# Patient Record
Sex: Female | Born: 1960 | Race: White | Hispanic: No | Marital: Married | Smoking: Never smoker
Health system: Southern US, Community
[De-identification: ages and names within clinical notes are randomized; demographics above are authoritative.]

## PROBLEM LIST (undated history)

## (undated) DIAGNOSIS — K219 Gastro-esophageal reflux disease without esophagitis: Secondary | ICD-10-CM

## (undated) DIAGNOSIS — E079 Disorder of thyroid, unspecified: Secondary | ICD-10-CM

## (undated) HISTORY — DX: Gastro-esophageal reflux disease without esophagitis: K21.9

## (undated) HISTORY — PX: AUGMENTATION MAMMAPLASTY: SUR837

## (undated) HISTORY — DX: Disorder of thyroid, unspecified: E07.9

---

## 1993-06-07 HISTORY — PX: AUGMENTATION MAMMAPLASTY: SUR837

## 1998-08-28 ENCOUNTER — Ambulatory Visit: Admission: RE | Admit: 1998-08-28 | Discharge: 1998-08-28 | Payer: Self-pay | Admitting: Gynecology

## 1998-09-12 ENCOUNTER — Inpatient Hospital Stay (HOSPITAL_COMMUNITY): Admission: RE | Admit: 1998-09-12 | Discharge: 1998-09-13 | Payer: Self-pay | Admitting: Gynecology

## 1999-03-17 ENCOUNTER — Other Ambulatory Visit: Admission: RE | Admit: 1999-03-17 | Discharge: 1999-03-17 | Payer: Self-pay | Admitting: Gynecology

## 2000-09-13 ENCOUNTER — Other Ambulatory Visit: Admission: RE | Admit: 2000-09-13 | Discharge: 2000-09-13 | Payer: Self-pay | Admitting: Gynecology

## 2002-01-09 ENCOUNTER — Other Ambulatory Visit: Admission: RE | Admit: 2002-01-09 | Discharge: 2002-01-09 | Payer: Self-pay | Admitting: Gynecology

## 2006-01-04 ENCOUNTER — Other Ambulatory Visit: Admission: RE | Admit: 2006-01-04 | Discharge: 2006-01-04 | Payer: Self-pay | Admitting: Gynecology

## 2007-01-05 ENCOUNTER — Other Ambulatory Visit: Admission: RE | Admit: 2007-01-05 | Discharge: 2007-01-05 | Payer: Self-pay | Admitting: Gynecology

## 2008-04-16 ENCOUNTER — Ambulatory Visit: Payer: Self-pay | Admitting: Internal Medicine

## 2008-08-03 ENCOUNTER — Ambulatory Visit: Payer: Self-pay | Admitting: Internal Medicine

## 2009-03-11 ENCOUNTER — Ambulatory Visit: Payer: Self-pay | Admitting: Internal Medicine

## 2009-06-18 ENCOUNTER — Ambulatory Visit: Payer: Self-pay | Admitting: Gastroenterology

## 2009-06-27 LAB — HM COLONOSCOPY: HM Colonoscopy: NORMAL

## 2010-03-26 ENCOUNTER — Ambulatory Visit: Payer: Self-pay | Admitting: Internal Medicine

## 2010-05-26 ENCOUNTER — Ambulatory Visit: Payer: Self-pay | Admitting: Internal Medicine

## 2010-12-04 ENCOUNTER — Other Ambulatory Visit: Payer: Self-pay | Admitting: Internal Medicine

## 2011-01-25 ENCOUNTER — Other Ambulatory Visit: Payer: Self-pay | Admitting: Internal Medicine

## 2011-01-25 MED ORDER — LEVOTHYROXINE SODIUM 88 MCG PO TABS: 88.0000 ug | ORAL_TABLET | Freq: Every day | ORAL | Status: DC

## 2011-03-15 ENCOUNTER — Ambulatory Visit: Payer: Self-pay | Admitting: Internal Medicine

## 2011-03-31 ENCOUNTER — Ambulatory Visit (INDEPENDENT_AMBULATORY_CARE_PROVIDER_SITE_OTHER): Payer: PRIVATE HEALTH INSURANCE | Admitting: Internal Medicine

## 2011-03-31 ENCOUNTER — Encounter: Payer: Self-pay | Admitting: Internal Medicine

## 2011-03-31 DIAGNOSIS — Z124 Encounter for screening for malignant neoplasm of cervix: Secondary | ICD-10-CM

## 2011-03-31 DIAGNOSIS — K219 Gastro-esophageal reflux disease without esophagitis: Secondary | ICD-10-CM

## 2011-03-31 DIAGNOSIS — E039 Hypothyroidism, unspecified: Secondary | ICD-10-CM | POA: Insufficient documentation

## 2011-03-31 DIAGNOSIS — Z1239 Encounter for other screening for malignant neoplasm of breast: Secondary | ICD-10-CM

## 2011-03-31 NOTE — Assessment & Plan Note (Addendum)
Her repeat TSH 5.15 after reducing Synthroid brand name dose to 88 so she has resumed 100 mcg daily and feels much better x 3 weeks.  Will repeat tsh later on this fall to follow level.

## 2011-04-01 ENCOUNTER — Encounter: Payer: Self-pay | Admitting: Internal Medicine

## 2011-04-01 DIAGNOSIS — Z1239 Encounter for other screening for malignant neoplasm of breast: Secondary | ICD-10-CM | POA: Insufficient documentation

## 2011-04-01 DIAGNOSIS — Z124 Encounter for screening for malignant neoplasm of cervix: Secondary | ICD-10-CM | POA: Insufficient documentation

## 2011-04-01 DIAGNOSIS — K219 Gastro-esophageal reflux disease without esophagitis: Secondary | ICD-10-CM | POA: Insufficient documentation

## 2011-04-01 NOTE — Assessment & Plan Note (Signed)
She receives annual mammogram at [facility] and was given rx for this year's screening.

## 2011-04-01 NOTE — Assessment & Plan Note (Signed)
Her symptoms are infrequent and aggravated by large meals and wine.  She does not use a PPI on a daily basis.

## 2011-04-01 NOTE — Progress Notes (Signed)
  Subjective:    Patient ID: Governor Specking, female    DOB: [birth year], 50 y.o.   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  Several months ago her thyroid dose was reduced due to symptoms of fatigue concurrent with a TSH of 0.457.  She has had a repeat TSH one month ago which was now elevated at 5.15 and feels the same but has gained a few lbs.  She has resumed her previous dose 3 weeks ago and feels better.  She attributes her earlier fatigue to her schedule , which involved working full time and spending several nights per week with her convalescing elderly mother in [place].  She denies palpitations,  Tremor, headache and insomnia.  Past Medical History  Diagnosis Date  . hypothyroid   . GERD (gastroesophageal reflux disease)     infrequent   No current outpatient prescriptions on file prior to visit.     Review of Systems  Constitutional: Negative for fever, chills and unexpected weight change.  HENT: Negative for hearing loss, ear pain, nosebleeds, congestion, sore throat, facial swelling, rhinorrhea, sneezing, mouth sores, trouble swallowing, neck pain, neck stiffness, voice change, postnasal drip, sinus pressure, tinnitus and ear discharge.   Eyes: Negative for pain, discharge, redness and visual disturbance.  Respiratory: Negative for cough, chest tightness, shortness of breath, wheezing and stridor.   Cardiovascular: Negative for chest pain, palpitations and leg swelling.  Musculoskeletal: Negative for myalgias and arthralgias.  Skin: Negative for color change and rash.  Neurological: Negative for dizziness, weakness, light-headedness and headaches.  Hematological: Negative for adenopathy.       Objective:   Physical Exam  Constitutional: She is oriented to person, place, and time. She appears well-developed and well-nourished.  HENT:  Mouth/Throat: Oropharynx is  clear and moist.  Eyes: EOM are normal. Pupils are equal, round, and reactive to light. No scleral icterus.  Neck: Normal range of motion. Neck supple. No JVD present. No thyromegaly present.  Cardiovascular: Normal rate, regular rhythm, normal heart sounds and intact distal pulses.   Pulmonary/Chest: Effort normal and breath sounds normal.  Abdominal: Soft. Bowel sounds are normal. She exhibits no mass. There is no tenderness.  Musculoskeletal: Normal range of motion. She exhibits no edema.  Lymphadenopathy:    She has no cervical adenopathy.  Neurological: She is alert and oriented to person, place, and time.  Skin: Skin is warm and dry.  Psychiatric: She has a normal mood and affect.          Assessment & Plan:

## 2011-04-01 NOTE — Assessment & Plan Note (Signed)
She is postmenopausal x 7 years,  And her last PAP was 2008.  Have referred her to Senaida Lange for followup at her request.

## 2011-04-27 ENCOUNTER — Ambulatory Visit: Payer: Self-pay | Admitting: Internal Medicine

## 2011-05-02 ENCOUNTER — Other Ambulatory Visit: Payer: Self-pay | Admitting: Internal Medicine

## 2011-05-13 ENCOUNTER — Encounter: Payer: Self-pay | Admitting: Internal Medicine

## 2011-07-12 ENCOUNTER — Other Ambulatory Visit: Payer: Self-pay | Admitting: *Deleted

## 2011-07-12 NOTE — Telephone Encounter (Signed)
Faxed request from [facility] for synthroid 100 mcg's.  Chart has that pt takes 88 mcg's.  Please advise.

## 2011-07-13 MED ORDER — LEVOTHYROXINE SODIUM 100 MCG PO TABS: 100.0000 ug | ORAL_TABLET | Freq: Every day | ORAL | Status: DC

## 2011-07-13 NOTE — Telephone Encounter (Signed)
100 mcg dose

## 2011-07-13 NOTE — Telephone Encounter (Signed)
Per office notes the dose is 100 mcg daily. Refill and that dose thank you quantity 90 with 2 refills

## 2011-07-13 NOTE — Telephone Encounter (Signed)
Medicine called to [facility] pharmacy.

## 2012-01-13 ENCOUNTER — Encounter: Payer: Self-pay | Admitting: Internal Medicine

## 2012-04-25 ENCOUNTER — Encounter: Payer: Self-pay | Admitting: Internal Medicine

## 2012-04-25 ENCOUNTER — Ambulatory Visit (INDEPENDENT_AMBULATORY_CARE_PROVIDER_SITE_OTHER): Payer: PRIVATE HEALTH INSURANCE | Admitting: Internal Medicine

## 2012-04-25 VITALS — BP 118/68 | HR 74 | Temp 98.1°F | Resp 12 | Ht 60.0 in | Wt 115.5 lb

## 2012-04-25 DIAGNOSIS — Z124 Encounter for screening for malignant neoplasm of cervix: Secondary | ICD-10-CM

## 2012-04-25 DIAGNOSIS — E039 Hypothyroidism, unspecified: Secondary | ICD-10-CM

## 2012-04-25 DIAGNOSIS — N951 Menopausal and female climacteric states: Secondary | ICD-10-CM

## 2012-04-25 DIAGNOSIS — N941 Unspecified dyspareunia: Secondary | ICD-10-CM

## 2012-04-25 DIAGNOSIS — IMO0002 Reserved for concepts with insufficient information to code with codable children: Secondary | ICD-10-CM

## 2012-04-25 NOTE — Progress Notes (Signed)
Patient ID: Joy Horton, female   DOB: 04/01/1961, 51 y.o.   MRN: [id number]  Patient Active Problem List  Diagnosis  . Hypothyroidism  . GERD (gastroesophageal reflux disease)  . Screening for breast cancer  . Screening for cervical cancer  . Dyspareunia, female  . Menopausal vaginal dryness    Subjective:  CC:   Chief Complaint  Patient presents with  . Medication Refill    HPI:   Joy Horton is a 51 y.o. female who presents 12 month follow up on chronic conditions.  She was treated for a recent upper respiratory infection with azithromycin by Dr. Margette Fast for symptoms of sore throat myalgias without cough or fever. Her symptoms have resolved. She's had a recent gynecologic exam by Dr. Waverly Ferrari,   and has been prescribed Premarin cream  for symptoms of dyspareunia.     Past Medical History  Diagnosis Date  . hypothyroid   . GERD (gastroesophageal reflux disease)     infrequent    Past Surgical History  Procedure Date  . Augmentation mammaplasty          The following portions of the patient's history were reviewed and updated as appropriate: Allergies, current medications, and problem list.    Review of Systems:  Review of Systems  Constitutional: Negative for fever and chills.  HENT: Negative for congestion and sore throat.   Respiratory: Positive for wheezing. Negative for cough.   Cardiovascular: Negative for chest pain.  Gastrointestinal: Negative for heartburn.  Genitourinary: Negative for dysuria and flank pain.  Musculoskeletal: Negative for back pain.  Neurological: Negative for dizziness and headaches.  Endo/Heme/Allergies: Does not bruise/bleed easily.  Psychiatric/Behavioral: Negative for depression. The patient does not have insomnia.        History   Social History  . Marital Status: Married    Spouse Name: N/A    Number of Children: N/A  . Years of Education: N/A   Occupational History  . Not on file.    Social History Main Topics  . Smoking status: Never Smoker   . Smokeless tobacco: Never Used  . Alcohol Use: 2.5 oz/week    5 drink(s) per week  . Drug Use: No  . Sexually Active: Yes    Birth Control/ Protection: Post-menopausal   Other Topics Concern  . Not on file   Social History Narrative  . No narrative on file    Objective:  BP 118/68  Pulse 74  Temp 98.1 F (36.7 C) (Oral)  Resp 12  Ht 5' (1.524 m)  Wt 115 lb 8 oz (52.39 kg)  BMI 22.56 kg/m2  SpO2 98%  General appearance: alert, cooperative and appears stated age Ears: normal TM's and external ear canals both ears Throat: lips, mucosa, and tongue normal; teeth and gums normal Neck: no adenopathy, no carotid bruit, supple, symmetrical, trachea midline and thyroid not enlarged, symmetric, no tenderness/mass/nodules Back: symmetric, no curvature. ROM normal. No CVA tenderness. Lungs: clear to auscultation bilaterally Heart: regular rate and rhythm, S1, S2 normal, no murmur, click, rub or gallop Abdomen: soft, non-tender; bowel sounds normal; no masses,  no organomegaly Pulses: 2+ and symmetric Skin: Skin color, texture, turgor normal. No rashes or lesions Lymph nodes: Cervical, supraclavicular, and axillary nodes normal.  Assessment and Plan:  Hypothyroidism She is due for refill on Synthroid, but has not had TSH checked since October 2012 at which time her dose was increased for a TSH of 5.15. She has enough samples to get her through  the next week and she will get her labs including fasting lipids done at [facility].  Screening for cervical cancer Screening with Pap smear was done by Dr. Waverly Ferrari this year.  Menopausal vaginal dryness ZPremarin Cream prescribed by her gynecologist.  Dyspareunia, female Secondary to  postmenopausal vaginal dryness. Patient has started using Premarin vaginal cream.   Updated Medication List Outpatient Encounter Prescriptions as of 04/25/2012  Medication Sig Dispense Refill  .  levothyroxine (SYNTHROID, LEVOTHROID) 100 MCG tablet Take 1 tablet (100 mcg total) by mouth daily.  90 tablet  2  . Multiple Vitamin (MULTIVITAMIN) tablet Take 1 tablet by mouth daily.           Orders Placed This Encounter  Procedures  . HM MAMMOGRAPHY  . HM PAP SMEAR  . HM COLONOSCOPY    No Follow-up on file.

## 2012-04-26 ENCOUNTER — Other Ambulatory Visit: Payer: Self-pay | Admitting: Internal Medicine

## 2012-04-26 LAB — CBC WITH DIFFERENTIAL/PLATELET
Basophil #: 0.1 10*3/uL (ref 0.0–0.1)
Eosinophil %: 4.7 %
HGB: 13.1 g/dL (ref 12.0–16.0)
Lymphocyte #: 1.8 10*3/uL (ref 1.0–3.6)
Lymphocyte %: 23.7 %
MCV: 93 fL (ref 80–100)
Monocyte %: 7.3 %
Neutrophil #: 4.7 10*3/uL (ref 1.4–6.5)
Neutrophil %: 62.9 %
RDW: 12.4 % (ref 11.5–14.5)

## 2012-04-26 LAB — COMPREHENSIVE METABOLIC PANEL
Alkaline Phosphatase: 67 U/L (ref 50–136)
Anion Gap: 5 — ABNORMAL LOW (ref 7–16)
BUN: 14 mg/dL (ref 7–18)
Bilirubin,Total: 0.5 mg/dL (ref 0.2–1.0)
Chloride: 106 mmol/L (ref 98–107)
Creatinine: 0.6 mg/dL (ref 0.60–1.30)
EGFR (African American): >60
Osmolality: 279 (ref 275–301)
Potassium: 3.9 mmol/L (ref 3.5–5.1)
SGOT(AST): 29 U/L (ref 15–37)
Sodium: 140 mmol/L (ref 136–145)
Total Protein: 7.6 g/dL (ref 6.4–8.2)

## 2012-04-27 ENCOUNTER — Ambulatory Visit: Payer: Self-pay | Admitting: Internal Medicine

## 2012-04-27 ENCOUNTER — Encounter: Payer: Self-pay | Admitting: Internal Medicine

## 2012-04-27 ENCOUNTER — Telehealth: Payer: Self-pay | Admitting: Internal Medicine

## 2012-04-27 DIAGNOSIS — N941 Unspecified dyspareunia: Secondary | ICD-10-CM | POA: Insufficient documentation

## 2012-04-27 DIAGNOSIS — L900 Lichen sclerosus et atrophicus: Secondary | ICD-10-CM | POA: Insufficient documentation

## 2012-04-27 LAB — HM MAMMOGRAPHY: HM Mammogram: NORMAL

## 2012-04-27 NOTE — Telephone Encounter (Signed)
Labs are all normal.

## 2012-04-27 NOTE — Assessment & Plan Note (Signed)
She is due for refill on Synthroid, but has not had TSH checked since October 2012 at which time her dose was increased for a TSH of 5.15. She has enough samples to get her through the next week and she will get her labs including fasting lipids done at [facility].

## 2012-04-27 NOTE — Assessment & Plan Note (Signed)
ZPremarin Cream prescribed by her gynecologist.

## 2012-04-27 NOTE — Assessment & Plan Note (Signed)
Secondary to  postmenopausal vaginal dryness. Patient has started using Premarin vaginal cream.

## 2012-04-27 NOTE — Telephone Encounter (Signed)
Left message with patient spouse that all labs were normal.

## 2012-04-27 NOTE — Assessment & Plan Note (Signed)
Screening with Pap smear was done by Dr. Waverly Ferrari this year.

## 2012-05-08 ENCOUNTER — Encounter: Payer: Self-pay | Admitting: Internal Medicine

## 2012-05-08 ENCOUNTER — Other Ambulatory Visit: Payer: Self-pay | Admitting: Internal Medicine

## 2012-05-08 MED ORDER — LEVOTHYROXINE SODIUM 100 MCG PO TABS: 100.0000 ug | ORAL_TABLET | Freq: Every day | ORAL | Status: DC

## 2012-05-08 NOTE — Progress Notes (Signed)
Patient notified via phone of lab results.

## 2013-02-08 ENCOUNTER — Other Ambulatory Visit: Payer: Self-pay | Admitting: *Deleted

## 2013-02-08 MED ORDER — LEVOTHYROXINE SODIUM 100 MCG PO TABS: 100.0000 ug | ORAL_TABLET | Freq: Every day | ORAL | Status: DC

## 2013-02-12 ENCOUNTER — Encounter: Payer: Self-pay | Admitting: Internal Medicine

## 2013-02-12 ENCOUNTER — Ambulatory Visit (INDEPENDENT_AMBULATORY_CARE_PROVIDER_SITE_OTHER): Payer: 59 | Admitting: Internal Medicine

## 2013-02-12 VITALS — BP 108/68 | HR 80 | Temp 98.3°F | Resp 12 | Ht 60.0 in | Wt 113.2 lb

## 2013-02-12 DIAGNOSIS — J069 Acute upper respiratory infection, unspecified: Secondary | ICD-10-CM

## 2013-02-12 DIAGNOSIS — E039 Hypothyroidism, unspecified: Secondary | ICD-10-CM

## 2013-02-12 DIAGNOSIS — M47812 Spondylosis without myelopathy or radiculopathy, cervical region: Secondary | ICD-10-CM | POA: Insufficient documentation

## 2013-02-12 MED ORDER — LEVOTHYROXINE SODIUM 100 MCG PO TABS: 100.0000 ug | ORAL_TABLET | Freq: Every day | ORAL | Status: DC

## 2013-02-12 NOTE — Assessment & Plan Note (Signed)
Suggested by history,  Exam normal.  recommended strengthening exercises with low weights.  Reassess height of chair and keyboard at work station

## 2013-02-12 NOTE — Assessment & Plan Note (Signed)
With bilateral cervical LAD  Tonsillar erythema and rhinitis .  Symptomatic management.

## 2013-02-12 NOTE — Assessment & Plan Note (Signed)
Continue current dose of synthroid generic and repeat TSH at [facility] in November.

## 2013-02-12 NOTE — Progress Notes (Signed)
Patient ID: Joy Horton, female   DOB: 04/18/1961, 52 y.o.   MRN: [id number]  Patient Active Problem List   Diagnosis Date Noted  . Viral URI 02/12/2013  . Dyspareunia, female 04/27/2012  . Menopausal vaginal dryness 04/27/2012  . Screening for breast cancer 04/01/2011  . Screening for cervical cancer 04/01/2011  . GERD (gastroesophageal reflux disease)   . Hypothyroidism 03/31/2011    Subjective:  CC:   Chief Complaint  Patient presents with  . Follow-up    med refills    HPI:   Joy Horton a 52 y.o. female who presents for 6 month follow up on hypothyroidism.  Energy level fine, weight stabe,  Not exercising   Today feels "blah"  ,  Some sneezing and runny nose,  Attributed it to cat allergy.  Took claritin.  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 hypothyroid   . GERD (gastroesophageal reflux disease)     infrequent    Past Surgical History  Procedure Laterality Date  . Augmentation mammaplasty         The following portions of the patient's history were reviewed and updated as appropriate: Allergies, current medications, and problem list.    Review of Systems:   12 Pt  review of systems was negative except those addressed in the HPI,     History   Social History  . Marital Status: Married    Spouse Name: N/A    Number of Children: N/A  . Years of Education: N/A   Occupational History  . Not on file.   Social History Main Topics  . Smoking status: Never Smoker   . Smokeless tobacco: Never Used  . Alcohol Use: 2.5 oz/week    5 drink(s) per week  . Drug Use: No  . Sexual Activity: Yes    Birth Control/ Protection: Post-menopausal   Other Topics Concern  . Not on file   Social History Narrative  . No narrative on file     Objective:  Filed Vitals:   02/12/13 1333  BP: 108/68  Pulse: 80  Temp: 98.3 F (36.8 C)  Resp: 12     General appearance: alert, cooperative and appears stated age Ears: normal TM's and external ear canals both ears Throat: lips, mucosa, and tongue normal; tonsillar erythema noted Neck:  no carotid bruit, supple, symmetrical, trachea midline and thyroid not enlarged, symmetric, no tenderness/mass/nodules Back: symmetric, no curvature. ROM normal. No CVA tenderness. Lungs: clear to auscultation bilaterally Heart: regular rate and rhythm, S1, S2 normal, no murmur, click, rub or gallop Abdomen: soft, non-tender; bowel sounds normal; no masses,  no organomegaly Pulses: 2+ and symmetric Skin: Skin color, texture, turgor normal. No rashes or lesions Lymph nodes: Cervical LAD noted today,  Other nodes  normal.  Assessment and Plan:  Viral URI With bilateral cervical LAD  Tonsillar erythema and rhinitis .  Symptomatic management.    Hypothyroidism Continue current dose of synthroid generic and repeat TSH at [facility] in November.   Cervical spondylosis without myelopathy Suggested by history,  Exam normal.  recommended strengthening exercises with low weights.  Reassess height of chair and keyboard at work station    Updated Medication List Outpatient Encounter Prescriptions as of 02/12/2013  Medication Sig Dispense Refill  .  levothyroxine (SYNTHROID, LEVOTHROID) 100 MCG tablet Take 1 tablet (100 mcg total) by mouth daily.  90 tablet  3  . [DISCONTINUED] levothyroxine (SYNTHROID, LEVOTHROID) 100 MCG tablet Take 1 tablet (100 mcg total) by mouth daily.  90 tablet  0  . Multiple Vitamin (MULTIVITAMIN) tablet Take 1 tablet by mouth daily.         No facility-administered encounter medications on file as of 02/12/2013.

## 2013-03-06 ENCOUNTER — Ambulatory Visit (INDEPENDENT_AMBULATORY_CARE_PROVIDER_SITE_OTHER): Payer: 59 | Admitting: Adult Health

## 2013-03-06 ENCOUNTER — Encounter: Payer: Self-pay | Admitting: Adult Health

## 2013-03-06 ENCOUNTER — Telehealth: Payer: Self-pay | Admitting: Internal Medicine

## 2013-03-06 VITALS — BP 102/70 | HR 69 | Temp 98.8°F | Resp 12 | Wt 113.5 lb

## 2013-03-06 DIAGNOSIS — J329 Chronic sinusitis, unspecified: Secondary | ICD-10-CM

## 2013-03-06 MED ORDER — FLUTICASONE PROPIONATE 50 MCG/ACT NA SUSP: NASAL | Status: DC

## 2013-03-06 MED ORDER — AMOXICILLIN-POT CLAVULANATE 875-125 MG PO TABS: 1.0000 | ORAL_TABLET | Freq: Two times a day (BID) | ORAL | Status: DC

## 2013-03-06 NOTE — Telephone Encounter (Signed)
Patient Information:  Caller Name: Marcelino Duster  Phone: [phone]  Patient: Joy Horton, Joy Horton  Gender: Female  DOB: [birth year]  Age: 52 Years  PCP: Duncan Dull (Adults only)  Pregnant: No  Office Follow Up:  Does the office need to follow up with this patient?: No  Instructions For The Office: N/A  RN Note:  Menopausal. Seen 02/12/13 for viral URI.  Symptoms improved but then returned. Denies sinus pain or cough.    Symptoms  Reason For Call & Symptoms: Recurrent nasal congestion, hoarseness, postnasal drip, and bilateral swollen glands in neck; no cough or fever.  Due to get Flu shot for work but concerned about symptoms.  Reviewed Health History In EMR: Yes  Reviewed Medications In EMR: Yes  Reviewed Allergies In EMR: Yes  Reviewed Surgeries / Procedures: Yes  Date of Onset of Symptoms: 02/28/2013  Treatments Tried: NyQuil, Tylenol, Advil  Treatments Tried Worked: Yes OB / GYN:  LMP: Unknown  Guideline(s) Used:  Colds  Disposition Per Guideline:   See Today or Tomorrow in Office  Reason For Disposition Reached:   Patient wants to be seen  Advice Given:  Reassurance  It sounds like an uncomplicated cold that we can treat at home.  Colds are caused by viruses, and no medicine or "shot" will cure an uncomplicated cold.  Colds are usually not serious.  For a Runny Nose With Profuse Discharge:   Nasal mucus and discharge helps to wash viruses and bacteria out of the nose and sinuses.  Blowing the nose is all that is needed.  For a Stuffy Nose - Use Nasal Washes:  Introduction: Saline (salt water) nasal irrigation (nasal wash) is an effective and simple home remedy for treating stuffy nose and sinus congestion. The nose can be irrigated by pouring, spraying, or squirting salt water into the nose and then letting it run back out.  Humidifier:  If the air in your home is dry, use a cool-mist humidifier  Contagiousness:  You can return to work or school after the fever is  gone and you feel well enough to participate in normal activities.  Expected Course:   Nasal discharge 7-14 days  Cough up to 2-3 weeks.  Call Back If:  Difficulty breathing occurs  Fever lasts more than 3 days  Nasal discharge lasts more than 10 days  You become worse  Patient Will Follow Care Advice:  YES  Appointment Scheduled:  03/06/2013 11:30:00 Appointment Scheduled Provider:  Orville Govern

## 2013-03-06 NOTE — Progress Notes (Signed)
  Subjective:    Patient ID: Joy Horton, female    DOB: [birth year], 52 y.o.   MRN: [id number]  HPI  Pt reports sinus congestion and hoarseness for 3 weeks. She saw Dr. Darrick Huntsman on 9/8 and was told it was viral but to call back if wasn't improved in a week.  Pt states she began to feel better but began feeling worse again last Wednesday.  Pt reports thick yellow-green mucous drainage.  Pt states she called and was told she needed to come back.   Current Outpatient Prescriptions on File Prior to Visit  Medication Sig Dispense Refill  . levothyroxine (SYNTHROID, LEVOTHROID) 100 MCG tablet Take 1 tablet (100 mcg total) by mouth daily.  90 tablet  3  . Multiple Vitamin (MULTIVITAMIN) tablet Take 1 tablet by mouth daily.         No current facility-administered medications on file prior to visit.     Review of Systems  Constitutional: Positive for fatigue.  HENT: Positive for congestion, voice change, postnasal drip and sinus pressure.   Eyes: Negative.   Respiratory: Positive for cough.   Cardiovascular: Negative.   Gastrointestinal: Negative.   Skin: Negative.   Psychiatric/Behavioral: Negative.        Objective:   Physical Exam  Constitutional: She is oriented to person, place, and time. She appears well-developed and well-nourished.  HENT:  Head: Normocephalic and atraumatic.  Mouth/Throat: Posterior oropharyngeal erythema present.  Neck: Normal range of motion.  Cardiovascular: Normal rate, regular rhythm and normal heart sounds.   Pulmonary/Chest: Effort normal and breath sounds normal. No respiratory distress. She has no wheezes. She has no rales.  Lymphadenopathy:    She has no cervical adenopathy.  Neurological: She is alert and oriented to person, place, and time.  Skin: Skin is warm and dry.  Psychiatric: She has a normal mood and affect. Her behavior is normal. Judgment and thought content normal.    BP 102/70  Pulse 69  Temp(Src) 98.8 F (37.1 C) (Oral)   Resp 12  Wt 113 lb 8 oz (51.483 kg)  BMI 22.17 kg/m2  SpO2 99%       Assessment & Plan:

## 2013-03-06 NOTE — Telephone Encounter (Signed)
Appt with Raquel today 

## 2013-03-06 NOTE — Assessment & Plan Note (Signed)
Symptoms ongoing for greater than 10 days. Start Augmentin bid x 10 days. flonase nasal spray - 2 sprays into each nostril daily.

## 2013-03-06 NOTE — Patient Instructions (Addendum)
  Start Augmentin twice a day for 10 days.  Flonase 2 sprays into each nostril daily.  Please call if your symptoms are not improved within 4-5 days.

## 2013-05-10 ENCOUNTER — Ambulatory Visit: Payer: Self-pay | Admitting: Internal Medicine

## 2013-06-04 ENCOUNTER — Encounter: Payer: Self-pay | Admitting: Internal Medicine

## 2014-05-01 ENCOUNTER — Encounter (INDEPENDENT_AMBULATORY_CARE_PROVIDER_SITE_OTHER): Payer: Self-pay

## 2014-05-01 ENCOUNTER — Ambulatory Visit (INDEPENDENT_AMBULATORY_CARE_PROVIDER_SITE_OTHER): Payer: 59 | Admitting: Internal Medicine

## 2014-05-01 ENCOUNTER — Encounter: Payer: Self-pay | Admitting: Internal Medicine

## 2014-05-01 VITALS — BP 102/60 | HR 69 | Temp 98.4°F | Resp 16 | Ht 60.0 in | Wt 111.5 lb

## 2014-05-01 DIAGNOSIS — W5503XA Scratched by cat, initial encounter: Secondary | ICD-10-CM

## 2014-05-01 DIAGNOSIS — E785 Hyperlipidemia, unspecified: Secondary | ICD-10-CM

## 2014-05-01 DIAGNOSIS — L0211 Cutaneous abscess of neck: Secondary | ICD-10-CM

## 2014-05-01 DIAGNOSIS — S0081XA Abrasion of other part of head, initial encounter: Secondary | ICD-10-CM

## 2014-05-01 DIAGNOSIS — R634 Abnormal weight loss: Secondary | ICD-10-CM

## 2014-05-01 DIAGNOSIS — E034 Atrophy of thyroid (acquired): Secondary | ICD-10-CM

## 2014-05-01 DIAGNOSIS — L03221 Cellulitis of neck: Secondary | ICD-10-CM

## 2014-05-01 DIAGNOSIS — E038 Other specified hypothyroidism: Secondary | ICD-10-CM

## 2014-05-01 DIAGNOSIS — Z0000 Encounter for general adult medical examination without abnormal findings: Secondary | ICD-10-CM

## 2014-05-01 LAB — COMPREHENSIVE METABOLIC PANEL
ALT: 15 U/L (ref 0–35)
AST: 20 U/L (ref 0–37)
Albumin: 4.5 g/dL (ref 3.5–5.2)
Alkaline Phosphatase: 56 U/L (ref 39–117)
BUN: 11 mg/dL (ref 6–23)
CO2: 28 mEq/L (ref 19–32)
CREATININE: 0.7 mg/dL (ref 0.4–1.2)
Calcium: 9.5 mg/dL (ref 8.4–10.5)
Chloride: 104 mEq/L (ref 96–112)
GFR: 90.67 mL/min (ref >60.00)
Glucose, Bld: 77 mg/dL (ref 70–99)
Potassium: 4.7 mEq/L (ref 3.5–5.1)
Sodium: 141 mEq/L (ref 135–145)
Total Bilirubin: 1 mg/dL (ref 0.2–1.2)
Total Protein: 6.6 g/dL (ref 6.0–8.3)

## 2014-05-01 LAB — CBC WITH DIFFERENTIAL/PLATELET
Basophils Absolute: 0 10*3/uL (ref 0.0–0.1)
Basophils Relative: 0.5 % (ref 0.0–3.0)
Eosinophils Absolute: 0.1 10*3/uL (ref 0.0–0.7)
Eosinophils Relative: 2.2 % (ref 0.0–5.0)
HEMATOCRIT: 40.8 % (ref 36.0–46.0)
HEMOGLOBIN: 13.7 g/dL (ref 12.0–15.0)
LYMPHS PCT: 25.9 % (ref 12.0–46.0)
Lymphs Abs: 1.4 10*3/uL (ref 0.7–4.0)
MCHC: 33.6 g/dL (ref 30.0–36.0)
MCV: 91.7 fl (ref 78.0–100.0)
MONOS PCT: 5.8 % (ref 3.0–12.0)
Monocytes Absolute: 0.3 10*3/uL (ref 0.1–1.0)
NEUTROS ABS: 3.5 10*3/uL (ref 1.4–7.7)
Neutrophils Relative %: 65.6 % (ref 43.0–77.0)
Platelets: 332 10*3/uL (ref 150.0–400.0)
RBC: 4.45 Mil/uL (ref 3.87–5.11)
RDW: 12.5 % (ref 11.5–15.5)
WBC: 5.4 10*3/uL (ref 4.0–10.5)

## 2014-05-01 LAB — LIPID PANEL
Cholesterol: 218 mg/dL — ABNORMAL HIGH (ref 0–200)
HDL: 51 mg/dL (ref >39.00)
LDL CALC: 147 mg/dL — AB (ref 0–99)
NonHDL: 167
TRIGLYCERIDES: 100 mg/dL (ref 0.0–149.0)
Total CHOL/HDL Ratio: 4
VLDL: 20 mg/dL (ref 0.0–40.0)

## 2014-05-01 LAB — TSH: TSH: 1.02 u[IU]/mL (ref 0.35–4.50)

## 2014-05-01 MED ORDER — AMOXICILLIN-POT CLAVULANATE 875-125 MG PO TABS: 1.0000 | ORAL_TABLET | Freq: Two times a day (BID) | ORAL | Status: DC

## 2014-05-01 NOTE — Progress Notes (Signed)
Patient ID: Joy Horton, female   DOB: 01/21/1961, 53 y.o.   MRN: [id number]   Patient Active Problem List   Diagnosis Date Noted  . Cat scratch of face 05/04/2014  . Cervical spondylosis without myelopathy 02/12/2013  . Dyspareunia, female 04/27/2012  . Menopausal vaginal dryness 04/27/2012  . Screening for breast cancer 04/01/2011  . Screening for cervical cancer 04/01/2011  . GERD (gastroesophageal reflux disease)   . Hypothyroidism 03/31/2011    Subjective:  CC:   Chief Complaint  Patient presents with  . Follow-up    medication, scratch on left side of neck from cat now has little nodule.  Marland Kitchen Hypothyroidism    patient is fasting if labs needed    HPI:   Joy Horton is a 53 y.o. female who presents for follow up on chronic medical issues and acute issues.   1)Tender red boil on left side of neck noticed yesterday, not draining.  Thinks it may have resulted from a cat scratch from her indoor/outdoor vaccinated cat   2) desire for weight loss.  She has been following a new diet called The Plan ,  Taking supplements .  Has lost 8 lbs so far   3) hypothyroidism . She has not had Tsh checked in over a year.  Taking medications daily in the morning,  No overt symptoms of under or overactive thyroid.     Past Medical History  Diagnosis Date  . hypothyroid   . GERD (gastroesophageal reflux disease)     infrequent    Past Surgical History  Procedure Laterality Date  . Augmentation mammaplasty         The following portions of the patient's history were reviewed and updated as appropriate: Allergies, current medications, and problem list.    Review of Systems:   Patient denies headache, fevers, malaise, unintentional weight loss, skin rash, eye pain, sinus congestion and sinus pain, sore throat, dysphagia,  hemoptysis , cough, dyspnea, wheezing, chest pain, palpitations, orthopnea, edema, abdominal pain, nausea, melena, diarrhea, constipation, flank  pain, dysuria, hematuria, urinary  Frequency, nocturia, numbness, tingling, seizures,  Focal weakness, Loss of consciousness,  Tremor, insomnia, depression, anxiety, and suicidal ideation.     History   Social History  . Marital Status: Married    Spouse Name: N/A    Number of Children: N/A  . Years of Education: N/A   Occupational History  . Not on file.   Social History Main Topics  . Smoking status: Never Smoker   . Smokeless tobacco: Never Used  . Alcohol Use: 2.5 oz/week    5 drink(s) per week  . Drug Use: No  . Sexual Activity: Yes    Birth Control/ Protection: Post-menopausal   Other Topics Concern  . Not on file   Social History Narrative    Objective:  Filed Vitals:   05/01/14 0855  BP: 102/60  Pulse: 69  Temp: 98.4 F (36.9 C)  Resp: 16     General appearance: alert, cooperative and appears stated age Ears: normal TM's and external ear canals both ears Throat: lips, mucosa, and tongue normal; teeth and gums normal Neck: left anterior triangle with tender red nonfluctuant boil.   No streaking, trachea midline and thyroid not enlarged, symmetric Back: symmetric, no curvature. ROM normal. No CVA tenderness. Lungs: clear to auscultation bilaterally Heart: regular rate and rhythm, S1, S2 normal, no murmur, click, rub or gallop Abdomen: soft, non-tender; bowel sounds normal; no masses,  no organomegaly Pulses: 2+ and  symmetric Skin: Skin color, texture, turgor normal. No rashes or lesions Lymph nodes: Cervical, supraclavicular, and axillary nodes normal.  Assessment and Plan:  Hypothyroidism Thyroid function is normal on current dose of levothyroxine. .  Lab Results  Component Value Date   TSH 1.02 05/01/2014    .   Cat scratch of face Empiric augmentin prescribed, along with direction to use warm compresses    Updated Medication List Outpatient Encounter Prescriptions as of 05/01/2014  Medication Sig  . Iodine, Kelp, TABS Take 1 tablet by  mouth daily.  . milk thistle 175 MG tablet Take 175 mg by mouth daily.  Marland Kitchen amoxicillin-clavulanate (AUGMENTIN) 875-125 MG per tablet Take 1 tablet by mouth 2 (two) times daily.  . fluticasone (FLONASE) 50 MCG/ACT nasal spray 2 sprays into each nostril daily (Patient not taking: Reported on 05/01/2014)  . levothyroxine (SYNTHROID, LEVOTHROID) 100 MCG tablet Take 1 tablet (100 mcg total) by mouth daily.  . Multiple Vitamin (MULTIVITAMIN) tablet Take 1 tablet by mouth daily.    . [DISCONTINUED] amoxicillin-clavulanate (AUGMENTIN) 875-125 MG per tablet Take 1 tablet by mouth 2 (two) times daily. (Patient not taking: Reported on 05/01/2014)

## 2014-05-01 NOTE — Progress Notes (Signed)
Pre-visit discussion using our clinic review tool. No additional management support is needed unless otherwise documented below in the visit note.  

## 2014-05-01 NOTE — Patient Instructions (Addendum)
I am treating you with Augmentin for the infection on your neck STOP TOUCHING IT!    Use warm compresses to encourage spontaneous  Drainage.   If the redness or swelling increases , OR IF YOUR NECK STARTS FEELING STIFF,  You will need to to go the ER for IV antibiotics.   Please take a probiotic ( Align, Floraque or Culturelle) while you are on the antibiotic to prevent a serious antibiotic associated diarrhea  Called clostridium dificile colitis and a vaginal yeast infection

## 2014-05-04 DIAGNOSIS — Z0000 Encounter for general adult medical examination without abnormal findings: Secondary | ICD-10-CM | POA: Insufficient documentation

## 2014-05-04 DIAGNOSIS — S0081XA Abrasion of other part of head, initial encounter: Secondary | ICD-10-CM | POA: Insufficient documentation

## 2014-05-04 DIAGNOSIS — W5503XA Scratched by cat, initial encounter: Secondary | ICD-10-CM

## 2014-05-04 NOTE — Assessment & Plan Note (Signed)

## 2014-05-04 NOTE — Assessment & Plan Note (Signed)
Empiric augmentin prescribed, along with direction to use warm compresses

## 2014-05-04 NOTE — Assessment & Plan Note (Signed)
Thyroid function is normal on current dose of levothyroxine. .  Lab Results  Component Value Date   TSH 1.02 05/01/2014    .

## 2014-05-05 ENCOUNTER — Encounter: Payer: Self-pay | Admitting: Internal Medicine

## 2014-05-13 MED ORDER — LEVOTHYROXINE SODIUM 100 MCG PO TABS: 100.0000 ug | ORAL_TABLET | Freq: Every day | ORAL | Status: DC

## 2014-05-13 NOTE — Telephone Encounter (Signed)
See mychart. I handled refill

## 2014-05-21 ENCOUNTER — Ambulatory Visit: Payer: Self-pay | Admitting: Internal Medicine

## 2014-07-02 ENCOUNTER — Encounter: Payer: Self-pay | Admitting: Internal Medicine

## 2015-05-26 ENCOUNTER — Other Ambulatory Visit: Payer: Self-pay | Admitting: Internal Medicine

## 2015-06-20 ENCOUNTER — Ambulatory Visit (INDEPENDENT_AMBULATORY_CARE_PROVIDER_SITE_OTHER): Payer: 59 | Admitting: Internal Medicine

## 2015-06-20 ENCOUNTER — Encounter: Payer: Self-pay | Admitting: Internal Medicine

## 2015-06-20 VITALS — BP 110/70 | HR 75 | Temp 98.1°F | Resp 12 | Ht 60.0 in | Wt 117.4 lb

## 2015-06-20 DIAGNOSIS — Z1159 Encounter for screening for other viral diseases: Secondary | ICD-10-CM

## 2015-06-20 DIAGNOSIS — E034 Atrophy of thyroid (acquired): Secondary | ICD-10-CM

## 2015-06-20 DIAGNOSIS — R5383 Other fatigue: Secondary | ICD-10-CM

## 2015-06-20 DIAGNOSIS — E038 Other specified hypothyroidism: Secondary | ICD-10-CM | POA: Diagnosis not present

## 2015-06-20 DIAGNOSIS — Z0000 Encounter for general adult medical examination without abnormal findings: Secondary | ICD-10-CM | POA: Diagnosis not present

## 2015-06-20 DIAGNOSIS — Z1239 Encounter for other screening for malignant neoplasm of breast: Secondary | ICD-10-CM

## 2015-06-20 DIAGNOSIS — E559 Vitamin D deficiency, unspecified: Secondary | ICD-10-CM

## 2015-06-20 DIAGNOSIS — J3089 Other allergic rhinitis: Secondary | ICD-10-CM

## 2015-06-20 LAB — CBC WITH DIFFERENTIAL/PLATELET
Basophils Absolute: 0 10*3/uL (ref 0.0–0.1)
Basophils Relative: 0.5 % (ref 0.0–3.0)
EOS PCT: 1.9 % (ref 0.0–5.0)
Eosinophils Absolute: 0.1 10*3/uL (ref 0.0–0.7)
HCT: 39.5 % (ref 36.0–46.0)
Hemoglobin: 13.1 g/dL (ref 12.0–15.0)
LYMPHS ABS: 2.2 10*3/uL (ref 0.7–4.0)
LYMPHS PCT: 28.5 % (ref 12.0–46.0)
MCHC: 33 g/dL (ref 30.0–36.0)
MCV: 93.1 fl (ref 78.0–100.0)
MONOS PCT: 6.6 % (ref 3.0–12.0)
Monocytes Absolute: 0.5 10*3/uL (ref 0.1–1.0)
NEUTROS PCT: 62.5 % (ref 43.0–77.0)
Neutro Abs: 4.7 10*3/uL (ref 1.4–7.7)
Platelets: 369 10*3/uL (ref 150.0–400.0)
RBC: 4.25 Mil/uL (ref 3.87–5.11)
RDW: 12.5 % (ref 11.5–15.5)
WBC: 7.5 10*3/uL (ref 4.0–10.5)

## 2015-06-20 LAB — COMPREHENSIVE METABOLIC PANEL
ALK PHOS: 60 U/L (ref 39–117)
ALT: 14 U/L (ref 0–35)
AST: 21 U/L (ref 0–37)
Albumin: 4.8 g/dL (ref 3.5–5.2)
BUN: 20 mg/dL (ref 6–23)
CHLORIDE: 102 meq/L (ref 96–112)
CO2: 29 mEq/L (ref 19–32)
Calcium: 9.9 mg/dL (ref 8.4–10.5)
Creatinine, Ser: 0.68 mg/dL (ref 0.40–1.20)
GFR: 94.91 mL/min (ref >60.00)
Glucose, Bld: 91 mg/dL (ref 70–99)
POTASSIUM: 4 meq/L (ref 3.5–5.1)
SODIUM: 140 meq/L (ref 135–145)
TOTAL PROTEIN: 7.7 g/dL (ref 6.0–8.3)
Total Bilirubin: 0.4 mg/dL (ref 0.2–1.2)

## 2015-06-20 LAB — TSH: TSH: 1.31 u[IU]/mL (ref 0.35–4.50)

## 2015-06-20 LAB — VITAMIN D 25 HYDROXY (VIT D DEFICIENCY, FRACTURES): VITD: 25.17 ng/mL — AB (ref 30.00–100.00)
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
## 2015-06-20 NOTE — Patient Instructions (Addendum)
The  diet I discussed with you today is the 10 day Green Smoothie Cleansing /Detox Diet by Linden Dolin . available on [place] for around $10.  This is not a low carb or a weight loss diet,  It is fundamentally a "cleansing" low fat diet that eliminates sugar, gluten, caffeine, alcohol and dairy for 10 days .  What you add back after the initial ten days is entirely up to  you!  You can expect to lose 5 to 10 lbs depending on how strict you are.   I found that  drinking 2 smoothies or juices  daily and keeping one chewable meal (but keep it simple, like baked fish and salad, rice or bok choy) kept me satisfied and kept me from straying  .  You snack primarily on fresh  fruit, egg whites and judicious quantities of nuts. I  Recommend adding a  vegetable based protein powder  To any smoothie made with almond milk.  (nothing with whey , since whey is dairy) in it.  [employer] has a [employer] officer, political party .   It does require some form of a nutrient extractor (Vita Mix, a electric juicer,  Or a Nutribullet Rx).  i have found that using frozen fruits is much more convenient and cost effective. You can even find plenty of organic fruit in the frozen fruit section of [employer].  Just thaw what you need for the following day the night before in the refrigerator (to avoid jamming up your machine)    The organic vegan protein powder I tried  is called Vega" and I found it at [employer] .  It is sugar free     You may want to try the "Orgain"  Brand of organic almond milk because it has more protein (10 mg per 8 ounce, compared to 1 gram/8 ounce) than the other brands of almond milk . It has the same amount of calcium as a glass of milk and is cholesterol free and low calorie.  Its available at [address] and at the Co [employer]

## 2015-06-20 NOTE — Progress Notes (Signed)
Pre-visit discussion using our clinic review tool. No additional management support is needed unless otherwise documented below in the visit note.  

## 2015-06-21 LAB — HEPATITIS C ANTIBODY: HCV Ab: NEGATIVE

## 2015-06-22 ENCOUNTER — Encounter: Payer: Self-pay | Admitting: Internal Medicine

## 2015-06-22 DIAGNOSIS — Z0000 Encounter for general adult medical examination without abnormal findings: Secondary | ICD-10-CM | POA: Insufficient documentation

## 2015-06-22 DIAGNOSIS — E559 Vitamin D deficiency, unspecified: Secondary | ICD-10-CM | POA: Insufficient documentation

## 2015-06-22 DIAGNOSIS — J309 Allergic rhinitis, unspecified: Secondary | ICD-10-CM | POA: Insufficient documentation

## 2015-06-22 NOTE — Assessment & Plan Note (Signed)
Annual comprehensive preventive exam was done as well as an evaluation and management of chronic conditions .  During the course of the visit the patient was educated and counseled about appropriate screening and preventive services including :  diabetes screening, lipid analysis with projected  10 year  risk for CAD  Which is 4.7 % using the Framingham risk calculator for women, , nutrition counseling, colorectal cancer screening, and recommended immunizations.  Printed recommendations for health maintenance screenings was given.   Lab Results  Component Value Date   CHOL 218* 05/01/2014   HDL 51.00 05/01/2014   LDLCALC 147* 05/01/2014   TRIG 100.0 05/01/2014   CHOLHDL 4 05/01/2014

## 2015-06-22 NOTE — Assessment & Plan Note (Addendum)
Managed with prn use of  flonase.  No reports of epistaxis.

## 2015-06-22 NOTE — Assessment & Plan Note (Signed)
Thyroid function is WNL on current dose.  No current changes needed.   Lab Results  Component Value Date   TSH 1.31 06/20/2015

## 2015-06-22 NOTE — Assessment & Plan Note (Signed)
Mild,  Increase  Daily dose to 2000 IUS during winter months.

## 2015-07-08 NOTE — Telephone Encounter (Signed)
Mailed unread message to patient.  

## 2015-07-18 DIAGNOSIS — B353 Tinea pedis: Secondary | ICD-10-CM | POA: Diagnosis not present

## 2015-07-18 DIAGNOSIS — Z1283 Encounter for screening for malignant neoplasm of skin: Secondary | ICD-10-CM | POA: Diagnosis not present

## 2015-07-18 DIAGNOSIS — Z08 Encounter for follow-up examination after completed treatment for malignant neoplasm: Secondary | ICD-10-CM | POA: Diagnosis not present

## 2015-07-18 DIAGNOSIS — Z85828 Personal history of other malignant neoplasm of skin: Secondary | ICD-10-CM | POA: Diagnosis not present

## 2015-12-02 ENCOUNTER — Ambulatory Visit (INDEPENDENT_AMBULATORY_CARE_PROVIDER_SITE_OTHER): Payer: 59 | Admitting: Family

## 2015-12-02 ENCOUNTER — Encounter: Payer: Self-pay | Admitting: Family

## 2015-12-02 VITALS — BP 102/84 | HR 68 | Temp 98.0°F | Ht 59.5 in | Wt 118.0 lb

## 2015-12-02 DIAGNOSIS — R21 Rash and other nonspecific skin eruption: Secondary | ICD-10-CM | POA: Diagnosis not present

## 2015-12-02 MED ORDER — DOXYCYCLINE HYCLATE 100 MG PO TABS: 100.0000 mg | ORAL_TABLET | Freq: Two times a day (BID) | ORAL | Status: DC

## 2015-12-02 MED ORDER — DOXYCYCLINE HYCLATE 100 MG PO TABS: ORAL_TABLET | ORAL | Status: DC

## 2015-12-02 NOTE — Patient Instructions (Signed)
Will treat for lyme.  Information below on Lyme disease and Tick bites in general.   If there is no improvement in your symptoms, or if there is any worsening of symptoms, or if you have any additional concerns, please return for re-evaluation; or, if we are closed, consider going to the Emergency Room for evaluation if symptoms urgent.  Patient education: Lyme disease (The Basics)[facility] in [place]  Written by the doctors and Therapist, music at [employer]  What is Lyme disease? - Lyme disease is an illness that can make you feel like you have the flu. It can also cause a rash, fever, or nerve, joint, or heart problems. People can get Lyme disease after being bitten by a tiny insect called a tick. When a certain type of tick bites you, it can transmit the germ that causes Lyme disease from its body to yours. But a tick can infect you only if it stays attached for at least a day. The ticks that carry Lyme disease feed on deer and mice. Ticks are found in tall grass and on shrubs, and can attach to animals and people walking by. Ticks cannot fly or jump. What are the symptoms of Lyme disease? - Symptoms can start days or weeks after a tick bite. They include: ?A rash where you were bitten - The rash often appears within a month of getting bitten. It is red, but its center can be the color of your skin. It might get bigger over a few days. To some, it looks like a "bull's eye" (picture 1). ?Fever ?Feeling tired ?Body aches and pains ?Heart problems such as a slowed heart rate ?Headache and stiff neck ?Feelings of pain, weakness, or numbness If a person is not treated, further symptoms can occur months to years after a tick bite. These include: ?Pain and swelling of joints, such as your knees ?Trouble with your memory and thinking ?Skin problems, such as skin swelling or thinning (this occurs mostly in [place]) Is there a test for Lyme disease? - Yes. Blood tests can show if you are infected with the germ that  causes Lyme disease. But, it takes time for the blood tests to turn positive. This means the tests won't work if you get them right after being bitten. Also, sometimes the blood tests come back negative even when you have the rash that goes with Lyme disease. Because of this, if you have the rash, the blood test is not needed to confirm that you have Lyme disease. If your doctor or nurse suspects you have Lyme disease, he or she will do an exam and ask you questions. The doctor or nurse will use this information (and your blood test result, if needed) to decide about treatment. What should I do if I get bitten by a tick or if my child gets bitten? - If you find a tick on your body or on your child, use tweezers to grab it. Then pull it out slowly and gently. After that, wash the area with soap and water. You do not need to keep the tick. But knowing what it looked like can help your doctor decide about your treatment. See if you can tell: ?Its color and size ?If it was attached to your skin or just resting on your skin ?If it was big, round, and full of blood (picture 2) You should watch the area around the bite for a month to see if a rash occurs. Should I see a doctor or nurse? -  See your doctor or nurse if you have a tick and you cannot get it off or if you think you have had a tick attached for at least 36 hours (a day and a half). You should also see a doctor or nurse if you develop symptoms of Lyme disease. Some people don't know that they were bitten by a tick. Or they might not remember having a rash or early symptoms of Lyme disease. How is Lyme disease treated? - Lyme disease is usually treated with antibiotics. Treatment with antibiotics should help your symptoms go away. Sometimes, symptoms improve quickly. Other times, it can take weeks or months for symptoms to go away. Your doctor might prescribe medicine for you to take right after a tick bite. Or your doctor might wait to see if you first  develop symptoms. Either way, the medicine will treat your Lyme disease. What can I do to try to avoid getting bitten by a tick? - You can: ?Wear shoes, long-sleeved shirts, and long pants when you go outside. Keep ticks away from your skin by tucking your pants into your socks. ?Wear light colors so you can spot any ticks that get on your clothes ?Use bug sprays to keep ticks off your skin or clothes ?Shower within 2 hours of being outdoors if you think you have been in an area where there are ticks ?Check your clothes and body for ticks after being outdoors. Be sure to check your scalp, waist, armpits, groin, and backs of your knees. Check your children, too. ?If you live in a place that has deer or mice nearby, take steps to keep those animals away. Deer and mice carry ticks.   Tick Bite Information Ticks are insects that attach themselves to the skin and draw blood for food. There are various types of ticks. Common types include wood ticks and deer ticks. Most ticks live in shrubs and grassy areas. Ticks can climb onto your body when you make contact with leaves or grass where the tick is waiting. The most common places on the body for ticks to attach themselves are the scalp, neck, armpits, waist, and groin. Most tick bites are harmless, but sometimes ticks carry germs that cause diseases. These germs can be spread to a person during the tick's feeding process. The chance of a disease spreading through a tick bite depends on:   The type of tick.  Time of year.   How long the tick is attached.   Geographic location.  HOW CAN YOU PREVENT TICK BITES? Take these steps to help prevent tick bites when you are outdoors:  Wear protective clothing. Long sleeves and long pants are best.   Wear white clothes so you can see ticks more easily.  Tuck your pant legs into your socks.   If walking on a trail, stay in the middle of the trail to avoid brushing against bushes.  Avoid walking  through areas with long grass.  Put insect repellent on all exposed skin and along boot tops, pant legs, and sleeve cuffs.   Check clothing, hair, and skin repeatedly and before going inside.   Brush off any ticks that are not attached.  Take a shower or bath as soon as possible after being outdoors.  WHAT IS THE PROPER WAY TO REMOVE A TICK? Ticks should be removed as soon as possible to help prevent diseases caused by tick bites. 1. If latex gloves are available, put them on before trying to remove a tick.  2. Using fine-point tweezers, grasp the tick as close to the skin as possible. You may also use curved forceps or a tick removal tool. Grasp the tick as close to its head as possible. Avoid grasping the tick on its body. 3. Pull gently with steady upward pressure until the tick lets go. Do not twist the tick or jerk it suddenly. This may break off the tick's head or mouth parts. 4. Do not squeeze or crush the tick's body. This could force disease-carrying fluids from the tick into your body.  5. After the tick is removed, wash the bite area and your hands with soap and water or other disinfectant such as alcohol. 6. Apply a small amount of antiseptic cream or ointment to the bite site.  7. Wash and disinfect any instruments that were used.  Do not try to remove a tick by applying a hot match, petroleum jelly, or fingernail polish to the tick. These methods do not work and may increase the chances of disease being spread from the tick bite.  WHEN SHOULD YOU SEEK MEDICAL CARE? Contact your health care provider if you are unable to remove a tick from your skin or if a part of the tick breaks off and is stuck in the skin.  After a tick bite, you need to be aware of signs and symptoms that could be related to diseases spread by ticks. Contact your health care provider if you develop any of the following in the days or weeks after the tick bite:  Unexplained fever.  Rash. A circular  rash that appears days or weeks after the tick bite may indicate the possibility of Lyme disease. The rash may resemble a target with a bull's-eye and may occur at a different part of your body than the tick bite.  Redness and swelling in the area of the tick bite.   Tender, swollen lymph glands.   Diarrhea.   Weight loss.   Cough.   Fatigue.   Muscle, joint, or bone pain.   Abdominal pain.   Headache.   Lethargy or a change in your level of consciousness.  Difficulty walking or moving your legs.   Numbness in the legs.   Paralysis.  Shortness of breath.   Confusion.   Repeated vomiting.    This information is not intended to replace advice given to you by your health care provider. Make sure you discuss any questions you have with your health care provider.   Document Released: 05/21/2000 Document Revised: 06/14/2014 Document Reviewed: 11/01/2012 Elsevier Interactive Patient Education [employer].

## 2015-12-02 NOTE — Progress Notes (Signed)
Subjective:    Patient ID: Joy Horton, female    DOB: 1960/07/05, 55 y.o.   MRN: [id number]   Joy Horton is a 55 y.o. female who presents today for an acute visit.    HPI Comments: Patient presents for evaluation of rash from insect bite on right thigh which appeared 2 weeks ago. Largely resolved now however here today primarily as vague symptoms persist and she now has muscle pain in right upper thigh proximal to insect bite. She describes the bite site as bull's eye rash. Endorses myalgias, headache, chills.Has taken Advil with relief.  Past Medical History  Diagnosis Date  . hypothyroid   . GERD (gastroesophageal reflux disease)     infrequent   Allergies: Review of patient's allergies indicates no known allergies. Current Outpatient Prescriptions on File Prior to Visit  Medication Sig Dispense Refill  . Multiple Vitamin (MULTIVITAMIN) tablet Take 1 tablet by mouth daily.      Marland Kitchen SYNTHROID 100 MCG tablet TAKE 1 TABLET (100 MCG TOTAL) BY MOUTH DAILY. 90 tablet 3   No current facility-administered medications on file prior to visit.    Social History  Substance Use Topics  . Smoking status: Never Smoker   . Smokeless tobacco: Never Used  . Alcohol Use: 2.5 oz/week    5 drink(s) per week    Review of Systems  Constitutional: Positive for chills. Negative for fever.  Gastrointestinal: Negative for nausea, vomiting and abdominal pain.  Musculoskeletal: Positive for myalgias. Negative for arthralgias.  Skin: Positive for rash.  Neurological: Positive for headaches.  Hematological: Negative for adenopathy.      Objective:    BP 102/84 mmHg  Pulse 68  Temp(Src) 98 F (36.7 C)  Ht 4' 11.5" (1.511 m)  Wt 118 lb (53.524 kg)  BMI 23.44 kg/m2  SpO2 99%   Physical Exam  Constitutional: She appears well-developed and well-nourished.  Eyes: Conjunctivae are normal.  Cardiovascular: Normal rate, regular rhythm, normal heart sounds and normal pulses.     Pulmonary/Chest: Effort normal and breath sounds normal. She has no wheezes. She has no rhonchi. She has no rales.  Musculoskeletal:       Right upper leg: She exhibits no tenderness, no bony tenderness, no swelling, no deformity and no laceration.       Left upper leg: She exhibits no tenderness, no bony tenderness, no swelling, no deformity and no laceration.  Proximal to papule, patient describes 'swelling and muscle pain.' Bilateral thighs symmetric.No nodules, cords, swelling, increased warmth of thighs. Strength normal.   Neurological: She is alert.  Skin: Skin is warm and dry. Rash noted. Rash is papular.     Psychiatric: She has a normal mood and affect. Her speech is normal and behavior is normal. Thought content normal.  Vitals reviewed.      Assessment & Plan:   1. Rash and nonspecific skin eruption Suspect lyme disease based on patient's description of rash and vague symptoms. We jointly decided not to order Lyme antibodies as may be falsely negative 2 weeks out and would not change our plan for treatment.   - doxycycline (VIBRA-TABS) 100 MG tablet; Take two tablets by mouth ( total of 200 mg) once for lyme prophylaxis.  Dispense: 2 tablet; Refill: 0    I am having Ms. Scallon maintain her multivitamin and SYNTHROID.   No orders of the defined types were placed in this encounter.     Start medications as prescribed and explained to patient on After  Visit Summary ( AVS). Risks, benefits, and alternatives of the medications and treatment plan prescribed today were discussed, and patient expressed understanding.   Education regarding symptom management and diagnosis given to patient.   Follow-up:Plan follow-up and return precautions given if any worsening symptoms or change in condition.   Continue to follow with Crecencio Mc, MD for routine health maintenance.   Levert Feinstein and I agreed with plan.   Mable Paris, FNP

## 2015-12-02 NOTE — Addendum Note (Signed)
Addended by: Burnard Hawthorne on: 12/02/2015 11:55 AM   Modules accepted: Orders, Medications

## 2015-12-18 ENCOUNTER — Ambulatory Visit: Payer: 59 | Admitting: Internal Medicine

## 2015-12-22 ENCOUNTER — Ambulatory Visit (INDEPENDENT_AMBULATORY_CARE_PROVIDER_SITE_OTHER): Payer: 59 | Admitting: Internal Medicine

## 2015-12-22 VITALS — BP 102/72 | HR 94 | Temp 98.5°F | Resp 12 | Ht 59.5 in | Wt 117.5 lb

## 2015-12-22 DIAGNOSIS — E785 Hyperlipidemia, unspecified: Secondary | ICD-10-CM

## 2015-12-22 DIAGNOSIS — A938 Other specified arthropod-borne viral fevers: Secondary | ICD-10-CM

## 2015-12-22 DIAGNOSIS — Z1239 Encounter for other screening for malignant neoplasm of breast: Secondary | ICD-10-CM

## 2015-12-22 DIAGNOSIS — E038 Other specified hypothyroidism: Secondary | ICD-10-CM | POA: Diagnosis not present

## 2015-12-22 DIAGNOSIS — E559 Vitamin D deficiency, unspecified: Secondary | ICD-10-CM

## 2015-12-22 DIAGNOSIS — T148 Other injury of unspecified body region: Secondary | ICD-10-CM | POA: Diagnosis not present

## 2015-12-22 DIAGNOSIS — W57XXXA Bitten or stung by nonvenomous insect and other nonvenomous arthropods, initial encounter: Secondary | ICD-10-CM

## 2015-12-22 DIAGNOSIS — E034 Atrophy of thyroid (acquired): Secondary | ICD-10-CM

## 2015-12-22 NOTE — Progress Notes (Signed)
Subjective:  Patient ID: Joy Horton, female    DOB: February 14, 1961  Age: 55 y.o. MRN: [id number]  CC: The primary encounter diagnosis was Breast cancer screening. Diagnoses of Tick bite, Hyperlipidemia, Hypothyroidism due to acquired atrophy of thyroid, Vitamin D deficiency, and Tick fever were also pertinent to this visit.  HPI Joy Horton presents for follow up on possible Lymes Disease.  Patient presented to NP two weeks ago with localized reaction to a presumed insect bite that occurred around June 14 .  The reaction appeared to resemble Erythema migrans and was accompanied by myalgias involving her lower back and  shoulders,  Intense fatigue , and a diffuse  headache fthat lasted one day .  low back pain lasted weeks.  she self treated with NSAIDs until she developed subjective fevers and chills ,    she was treated June 27  For two weeks with doxycycline.  She feels significantly better,       Outpatient Prescriptions Prior to Visit  Medication Sig Dispense Refill  . Multiple Vitamin (MULTIVITAMIN) tablet Take 1 tablet by mouth daily.      Marland Kitchen SYNTHROID 100 MCG tablet TAKE 1 TABLET (100 MCG TOTAL) BY MOUTH DAILY. 90 tablet 3  . doxycycline (VIBRA-TABS) 100 MG tablet Take 1 tablet (100 mg total) by mouth 2 (two) times daily. (Patient not taking: Reported on 12/22/2015) 28 tablet 0   No facility-administered medications prior to visit.    Review of Systems;  Patient denies headache, fevers, malaise, unintentional weight loss, skin rash, eye pain, sinus congestion and sinus pain, sore throat, dysphagia,  hemoptysis , cough, dyspnea, wheezing, chest pain, palpitations, orthopnea, edema, abdominal pain, nausea, melena, diarrhea, constipation, flank pain, dysuria, hematuria, urinary  Frequency, nocturia, numbness, tingling, seizures,  Focal weakness, Loss of consciousness,  Tremor, insomnia, depression, anxiety, and suicidal ideation.      Objective:  BP 102/72 mmHg  Pulse 94   Temp(Src) 98.5 F (36.9 C) (Oral)  Resp 12  Ht 4' 11.5" (1.511 m)  Wt 117 lb 8 oz (53.298 kg)  BMI 23.34 kg/m2  SpO2 98%  BP Readings from Last 3 Encounters:  12/22/15 102/72  12/02/15 102/84  06/20/15 110/70    Wt Readings from Last 3 Encounters:  12/22/15 117 lb 8 oz (53.298 kg)  12/02/15 118 lb (53.524 kg)  06/20/15 117 lb 6 oz (53.241 kg)    General appearance: alert, cooperative and appears stated age Ears: normal TM's and external ear canals both ears Throat: lips, mucosa, and tongue normal; teeth and gums normal Neck: no adenopathy, no carotid bruit, supple, symmetrical, trachea midline and thyroid not enlarged, symmetric, no tenderness/mass/nodules Back: symmetric, no curvature. ROM normal. No CVA tenderness. Lungs: clear to auscultation bilaterally Heart: regular rate and rhythm, S1, S2 normal, no murmur, click, rub or gallop Abdomen: soft, non-tender; bowel sounds normal; no masses,  no organomegaly Pulses: 2+ and symmetric Skin: Skin color, texture, turgor normal. No rashes or lesions Lymph nodes: Cervical, supraclavicular, and axillary nodes normal.  No results found for: HGBA1C  Lab Results  Component Value Date   CREATININE 0.68 06/20/2015   CREATININE 0.7 05/01/2014   CREATININE 0.60 04/26/2012    Lab Results  Component Value Date   WBC 7.5 06/20/2015   HGB 13.1 06/20/2015   HCT 39.5 06/20/2015   PLT 369.0 06/20/2015   GLUCOSE 91 06/20/2015   CHOL 218* 05/01/2014   TRIG 100.0 05/01/2014   HDL 51.00 05/01/2014   LDLCALC 147* 05/01/2014  ALT 14 06/20/2015   AST 21 06/20/2015   NA 140 06/20/2015   K 4.0 06/20/2015   CL 102 06/20/2015   CREATININE 0.68 06/20/2015   BUN 20 06/20/2015   CO2 29 06/20/2015   TSH 1.31 06/20/2015     Assessment & Plan:   Problem List Items Addressed This Visit    Tick fever    All systemic and localized symptoms have resolved s/p 2 weeks of doxy,  Started after presenting with EM and myalgias.  Return late  July for tick antibody testing.       Hypothyroidism   Relevant Orders   TSH   Vitamin D deficiency   Relevant Orders   VITAMIN D 25 Hydroxy (Vit-D Deficiency, Fractures)    Other Visit Diagnoses    Breast cancer screening    -  Primary    Relevant Orders    MM DIGITAL SCREENING BILATERAL    Tick bite        Relevant Orders    Comprehensive metabolic panel    Ehrlichia antibody panel    Rocky mtn spotted fvr abs pnl(IgG+IgM)    Lyme Ab/Western Blot Reflex    Hyperlipidemia        Relevant Orders    Lipid panel       I am having Ms. Moody maintain her multivitamin, SYNTHROID, and doxycycline.  No orders of the defined types were placed in this encounter.    There are no discontinued medications.  Follow-up: Return for fasting labs end of July .   Crecencio Mc, MD

## 2015-12-22 NOTE — Progress Notes (Signed)
Pre-visit discussion using our clinic review tool. No additional management support is needed unless otherwise documented below in the visit note.  

## 2015-12-23 DIAGNOSIS — A938 Other specified arthropod-borne viral fevers: Secondary | ICD-10-CM

## 2015-12-23 HISTORY — DX: Other specified arthropod-borne viral fevers: A93.8

## 2015-12-23 NOTE — Assessment & Plan Note (Signed)
All systemic and localized symptoms have resolved s/p 2 weeks of doxy,  Started after presenting with EM and myalgias.  Return late July for tick antibody testing.

## 2016-01-05 ENCOUNTER — Other Ambulatory Visit (INDEPENDENT_AMBULATORY_CARE_PROVIDER_SITE_OTHER): Payer: 59

## 2016-01-05 DIAGNOSIS — E038 Other specified hypothyroidism: Secondary | ICD-10-CM

## 2016-01-05 DIAGNOSIS — E559 Vitamin D deficiency, unspecified: Secondary | ICD-10-CM

## 2016-01-05 DIAGNOSIS — E034 Atrophy of thyroid (acquired): Secondary | ICD-10-CM | POA: Diagnosis not present

## 2016-01-05 DIAGNOSIS — W57XXXA Bitten or stung by nonvenomous insect and other nonvenomous arthropods, initial encounter: Secondary | ICD-10-CM

## 2016-01-05 DIAGNOSIS — E785 Hyperlipidemia, unspecified: Secondary | ICD-10-CM

## 2016-01-05 DIAGNOSIS — T148 Other injury of unspecified body region: Secondary | ICD-10-CM | POA: Diagnosis not present

## 2016-01-05 LAB — LIPID PANEL
CHOLESTEROL: 229 mg/dL — AB (ref 0–200)
HDL: 59.9 mg/dL (ref >39.00)
LDL CALC: 151 mg/dL — AB (ref 0–99)
NONHDL: 169.46
Total CHOL/HDL Ratio: 4
Triglycerides: 90 mg/dL (ref 0.0–149.0)
VLDL: 18 mg/dL (ref 0.0–40.0)

## 2016-01-05 LAB — COMPREHENSIVE METABOLIC PANEL
ALT: 18 U/L (ref 0–35)
AST: 24 U/L (ref 0–37)
Albumin: 4.7 g/dL (ref 3.5–5.2)
Alkaline Phosphatase: 57 U/L (ref 39–117)
BILIRUBIN TOTAL: 0.8 mg/dL (ref 0.2–1.2)
BUN: 19 mg/dL (ref 6–23)
CALCIUM: 9.9 mg/dL (ref 8.4–10.5)
CHLORIDE: 102 meq/L (ref 96–112)
CO2: 30 meq/L (ref 19–32)
CREATININE: 0.72 mg/dL (ref 0.40–1.20)
GFR: 88.68 mL/min (ref >60.00)
GLUCOSE: 82 mg/dL (ref 70–99)
Potassium: 4 mEq/L (ref 3.5–5.1)
Sodium: 140 mEq/L (ref 135–145)
Total Protein: 7.5 g/dL (ref 6.0–8.3)

## 2016-01-05 LAB — VITAMIN D 25 HYDROXY (VIT D DEFICIENCY, FRACTURES): VITD: 40.28 ng/mL (ref 30.00–100.00)

## 2016-01-05 LAB — TSH: TSH: 10.04 u[IU]/mL — AB (ref 0.35–4.50)

## 2016-01-06 LAB — LYME AB/WESTERN BLOT REFLEX

## 2016-01-07 ENCOUNTER — Other Ambulatory Visit: Payer: Self-pay | Admitting: Internal Medicine

## 2016-01-07 ENCOUNTER — Encounter: Payer: Self-pay | Admitting: Internal Medicine

## 2016-01-07 DIAGNOSIS — E034 Atrophy of thyroid (acquired): Secondary | ICD-10-CM

## 2016-01-07 LAB — ROCKY MTN SPOTTED FVR ABS PNL(IGG+IGM)
RMSF IGM: NOT DETECTED
RMSF IgG: NOT DETECTED

## 2016-01-07 MED ORDER — LEVOTHYROXINE SODIUM 112 MCG PO TABS: 112.0000 ug | ORAL_TABLET | Freq: Every day | ORAL | 3 refills | Status: DC

## 2016-01-12 LAB — EHRLICHIA ANTIBODY PANEL: E chaffeensis (HGE) Ab, IgG: <1:64 {titer}

## 2016-01-13 ENCOUNTER — Encounter: Payer: Self-pay | Admitting: Internal Medicine

## 2016-02-23 ENCOUNTER — Other Ambulatory Visit (INDEPENDENT_AMBULATORY_CARE_PROVIDER_SITE_OTHER): Payer: 59

## 2016-02-23 DIAGNOSIS — E038 Other specified hypothyroidism: Secondary | ICD-10-CM | POA: Diagnosis not present

## 2016-02-23 DIAGNOSIS — E034 Atrophy of thyroid (acquired): Secondary | ICD-10-CM | POA: Diagnosis not present

## 2016-02-24 LAB — T4 AND TSH
T4, Total: 7 ug/dL (ref 4.5–12.0)
TSH: 5.93 u[IU]/mL — AB (ref 0.450–4.500)

## 2016-02-25 ENCOUNTER — Other Ambulatory Visit: Payer: Self-pay | Admitting: Internal Medicine

## 2016-02-25 ENCOUNTER — Encounter: Payer: Self-pay | Admitting: Internal Medicine

## 2016-02-25 DIAGNOSIS — E034 Atrophy of thyroid (acquired): Secondary | ICD-10-CM

## 2016-02-25 MED ORDER — LEVOTHYROXINE SODIUM 125 MCG PO TABS: 125.0000 ug | ORAL_TABLET | Freq: Every day | ORAL | 1 refills | Status: DC

## 2016-02-25 NOTE — Assessment & Plan Note (Signed)
TSH IS 6 ON 112 MCG. DOSE INCREASED TO 125

## 2016-03-26 ENCOUNTER — Other Ambulatory Visit: Payer: Self-pay | Admitting: Internal Medicine

## 2016-03-26 ENCOUNTER — Ambulatory Visit
Admission: RE | Admit: 2016-03-26 | Discharge: 2016-03-26 | Disposition: A | Discharge status: Home / Self Care | Payer: 59 | Source: Ambulatory Visit | Attending: Internal Medicine | Admitting: Internal Medicine

## 2016-03-26 DIAGNOSIS — Z1239 Encounter for other screening for malignant neoplasm of breast: Secondary | ICD-10-CM

## 2016-03-26 DIAGNOSIS — Z1231 Encounter for screening mammogram for malignant neoplasm of breast: Secondary | ICD-10-CM | POA: Diagnosis not present

## 2016-08-30 DIAGNOSIS — D225 Melanocytic nevi of trunk: Secondary | ICD-10-CM | POA: Diagnosis not present

## 2016-08-30 DIAGNOSIS — B351 Tinea unguium: Secondary | ICD-10-CM | POA: Diagnosis not present

## 2016-08-30 DIAGNOSIS — D485 Neoplasm of uncertain behavior of skin: Secondary | ICD-10-CM | POA: Diagnosis not present

## 2016-08-30 DIAGNOSIS — Z85828 Personal history of other malignant neoplasm of skin: Secondary | ICD-10-CM | POA: Diagnosis not present

## 2016-08-30 DIAGNOSIS — L811 Chloasma: Secondary | ICD-10-CM | POA: Diagnosis not present

## 2016-08-30 DIAGNOSIS — C4490 Unspecified malignant neoplasm of skin, unspecified: Secondary | ICD-10-CM | POA: Diagnosis not present

## 2016-09-24 ENCOUNTER — Other Ambulatory Visit: Payer: Self-pay | Admitting: Internal Medicine

## 2016-10-13 ENCOUNTER — Telehealth: Payer: Self-pay | Admitting: Internal Medicine

## 2016-10-13 DIAGNOSIS — K5909 Other constipation: Secondary | ICD-10-CM

## 2016-10-13 DIAGNOSIS — E785 Hyperlipidemia, unspecified: Secondary | ICD-10-CM

## 2016-10-13 DIAGNOSIS — E034 Atrophy of thyroid (acquired): Secondary | ICD-10-CM

## 2016-10-13 NOTE — Telephone Encounter (Signed)
Spoke with pt and scheduled her both a lab appt and an OV with Dr. Derrel Nip. Pt is aware of appt date and time.

## 2016-10-13 NOTE — Telephone Encounter (Signed)
Fasting labs ordered to be done any time .  Schedule OV no rush

## 2016-10-13 NOTE — Telephone Encounter (Signed)
Pt called requesting to have lab work done for her thyroid. Please advise, thank you!  Call pt @ [phone]

## 2016-10-13 NOTE — Telephone Encounter (Signed)
Pt has not been in the office since 12/2015 and does not have an appt scheduled. Spoke with the pt and she stated that she thinks that her thyroid may be a little off due to being slightly constipated. Does she need to have an office visit or is it ok to just go ahead and schedule the lab appt? Please advise.

## 2016-11-02 ENCOUNTER — Other Ambulatory Visit: Payer: 59

## 2016-11-30 ENCOUNTER — Ambulatory Visit: Payer: 59 | Admitting: Internal Medicine

## 2017-01-10 ENCOUNTER — Other Ambulatory Visit (INDEPENDENT_AMBULATORY_CARE_PROVIDER_SITE_OTHER): Payer: 59

## 2017-01-10 DIAGNOSIS — K5909 Other constipation: Secondary | ICD-10-CM

## 2017-01-10 DIAGNOSIS — E034 Atrophy of thyroid (acquired): Secondary | ICD-10-CM | POA: Diagnosis not present

## 2017-01-10 DIAGNOSIS — E785 Hyperlipidemia, unspecified: Secondary | ICD-10-CM | POA: Diagnosis not present

## 2017-01-10 LAB — LIPID PANEL
CHOL/HDL RATIO: 4
Cholesterol: 226 mg/dL — ABNORMAL HIGH (ref 0–200)
HDL: 61.9 mg/dL (ref >39.00)
LDL Cholesterol: 149 mg/dL — ABNORMAL HIGH (ref 0–99)
NONHDL: 163.94
Triglycerides: 75 mg/dL (ref 0.0–149.0)
VLDL: 15 mg/dL (ref 0.0–40.0)

## 2017-01-10 LAB — TSH: TSH: 2.31 u[IU]/mL (ref 0.35–4.50)

## 2017-01-10 LAB — COMPREHENSIVE METABOLIC PANEL
ALT: 13 U/L (ref 0–35)
AST: 20 U/L (ref 0–37)
Albumin: 4.5 g/dL (ref 3.5–5.2)
Alkaline Phosphatase: 51 U/L (ref 39–117)
BUN: 21 mg/dL (ref 6–23)
CO2: 29 mEq/L (ref 19–32)
CREATININE: 0.72 mg/dL (ref 0.40–1.20)
Calcium: 9.5 mg/dL (ref 8.4–10.5)
Chloride: 103 mEq/L (ref 96–112)
GFR: 88.36 mL/min (ref >60.00)
GLUCOSE: 91 mg/dL (ref 70–99)
POTASSIUM: 3.9 meq/L (ref 3.5–5.1)
SODIUM: 139 meq/L (ref 135–145)
Total Bilirubin: 0.6 mg/dL (ref 0.2–1.2)
Total Protein: 7.3 g/dL (ref 6.0–8.3)

## 2017-01-18 ENCOUNTER — Ambulatory Visit: Payer: 59 | Admitting: Internal Medicine

## 2017-01-26 IMAGING — MG MM  DIGITAL SCREENING BREAST BILAT IMPLANT W/ TOMO W/ CAD
9 of 16 series · 9 of 32 positions shown · non-contrast
Comparison: Previous exam(s).

CLINICAL DATA: Screening.

EXAM:
2D DIGITAL SCREENING BILATERAL MAMMOGRAM WITH IMPLANTS, CAD AND
ADJUNCT TOMO
The patient has retropectoral implants. Standard and implant
displaced views were performed.

[L MLO (1 of 2)]
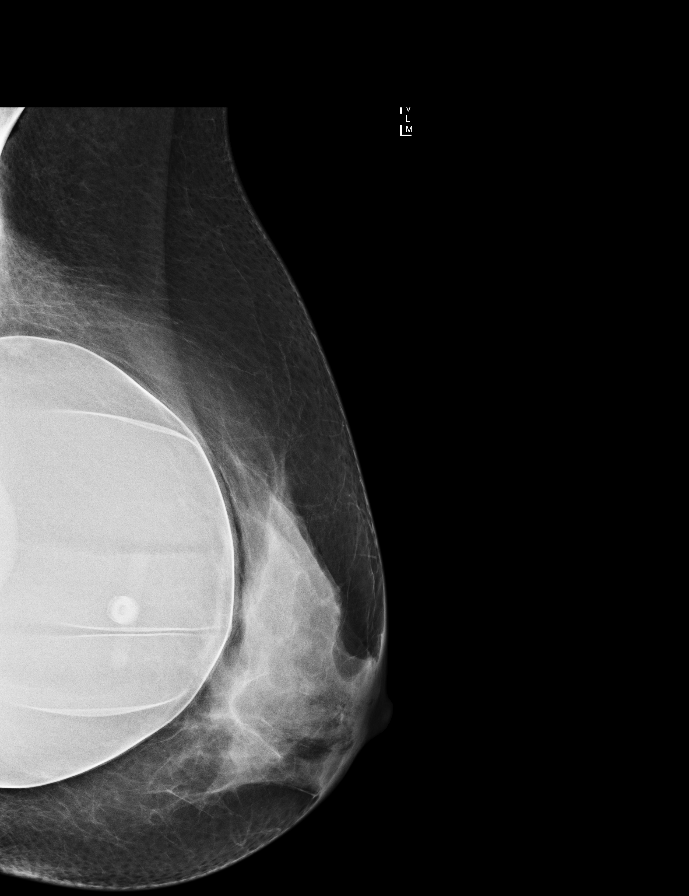

[R CC (1 of 2)]
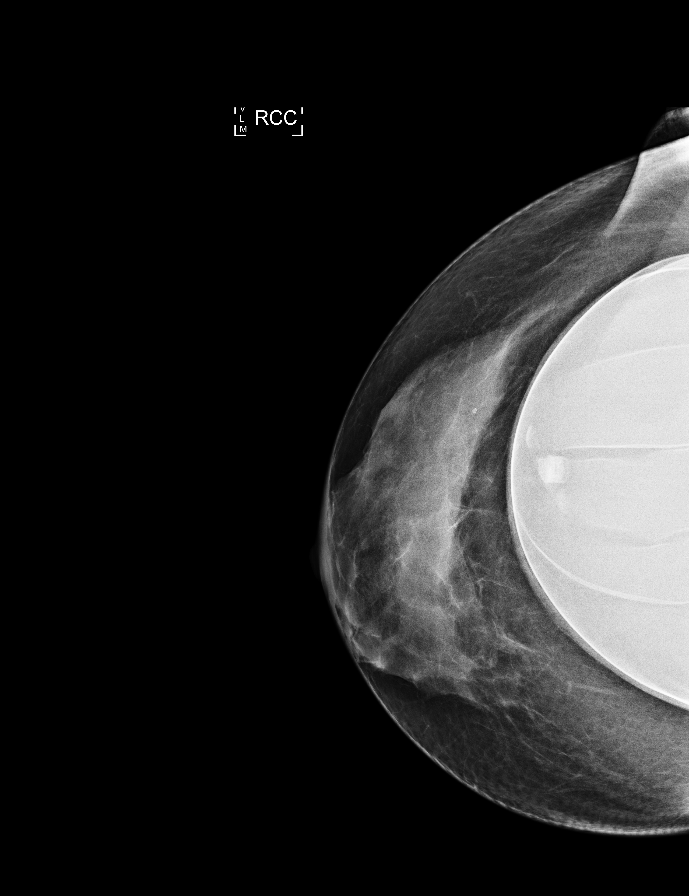

[L CC]
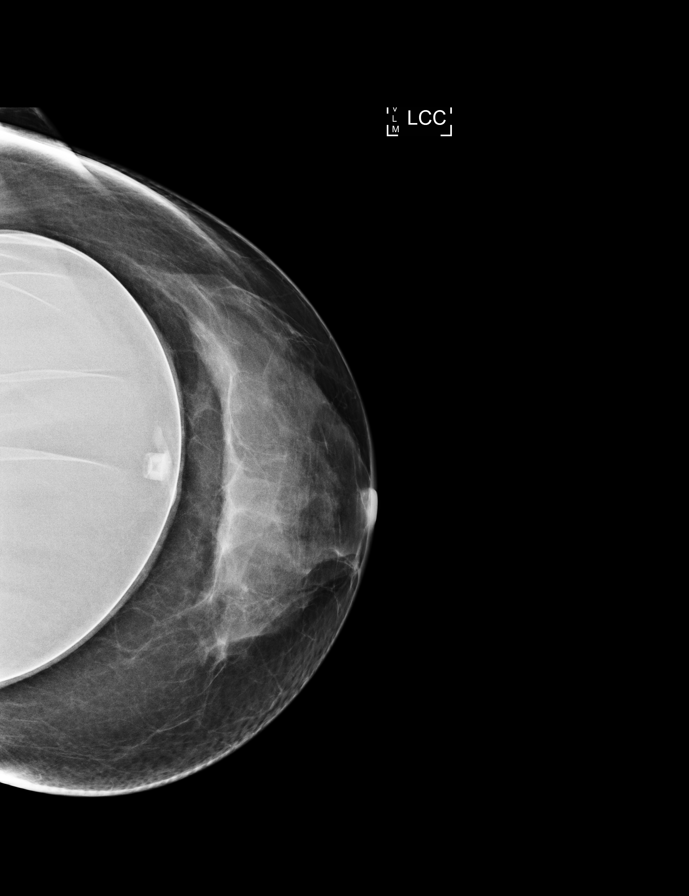

[R MLO]
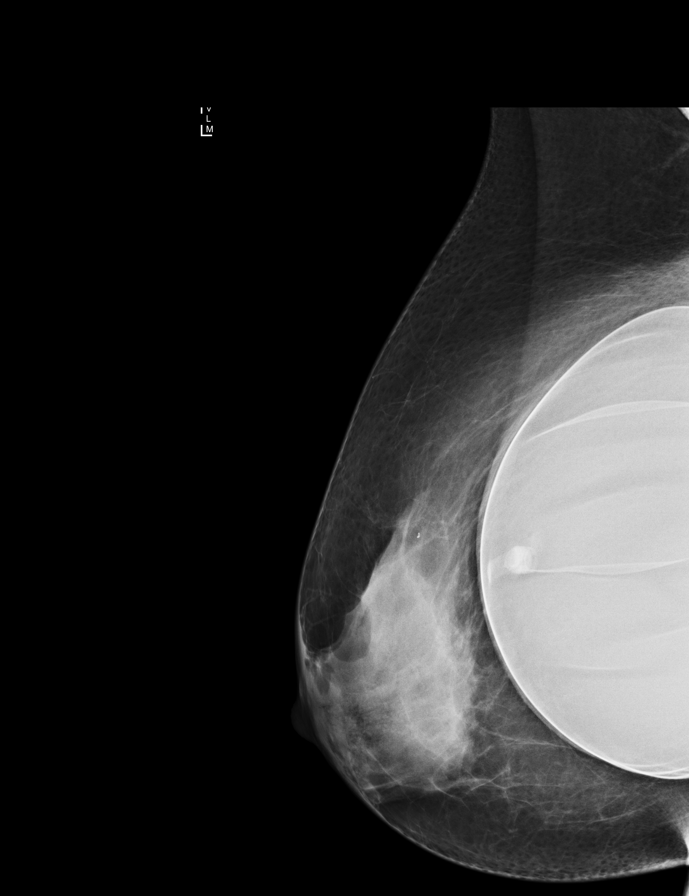

[L MLO synth-2D]
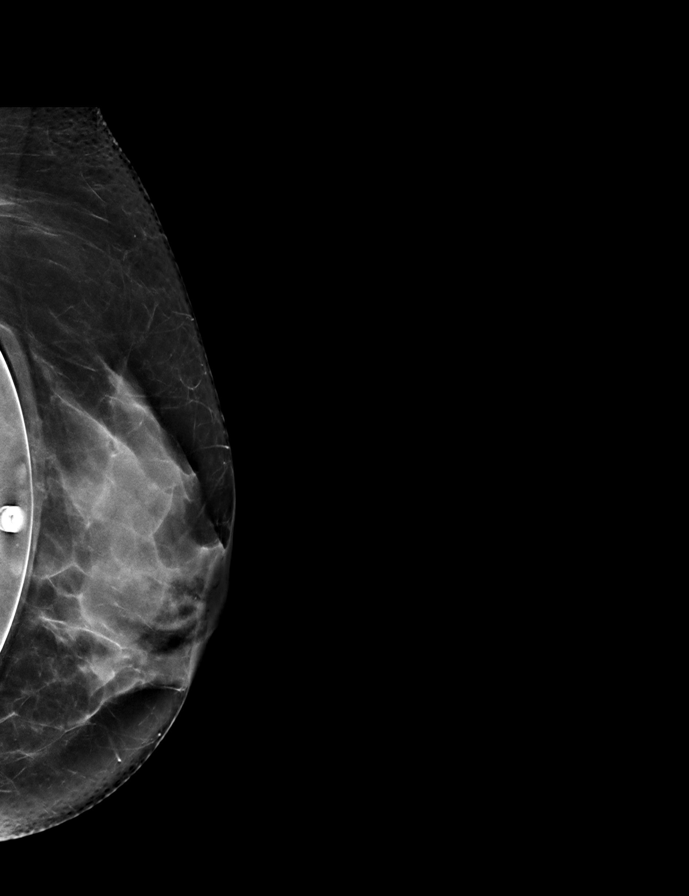

[L CC synth-2D]
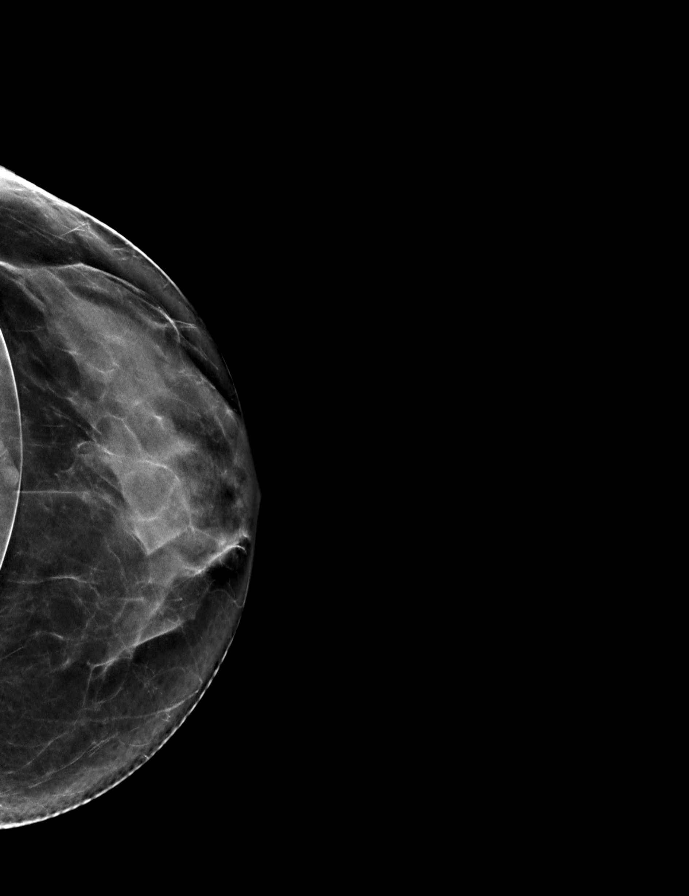

[R CC (2 of 2)]
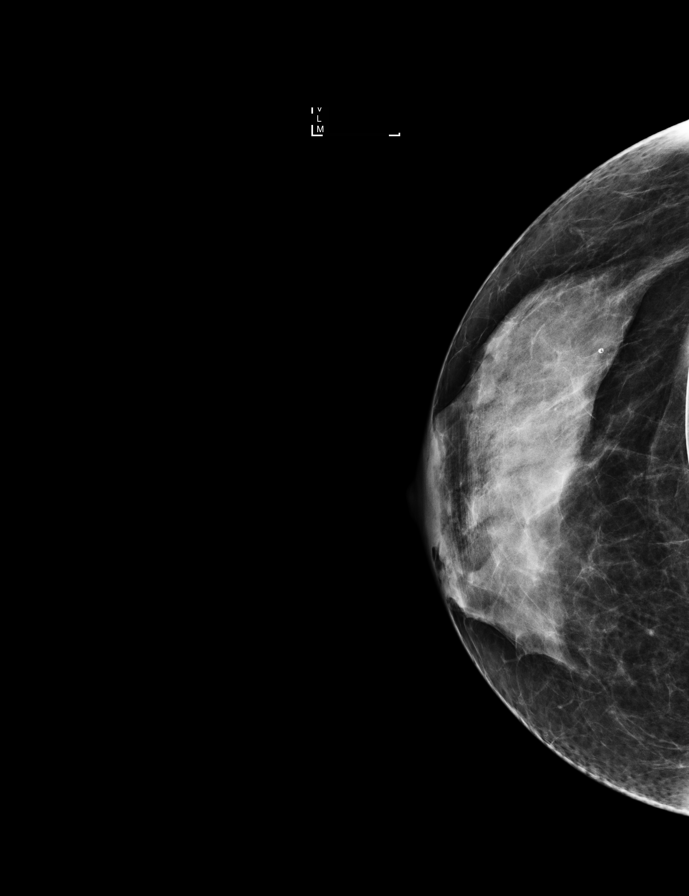

[R MLO synth-2D]
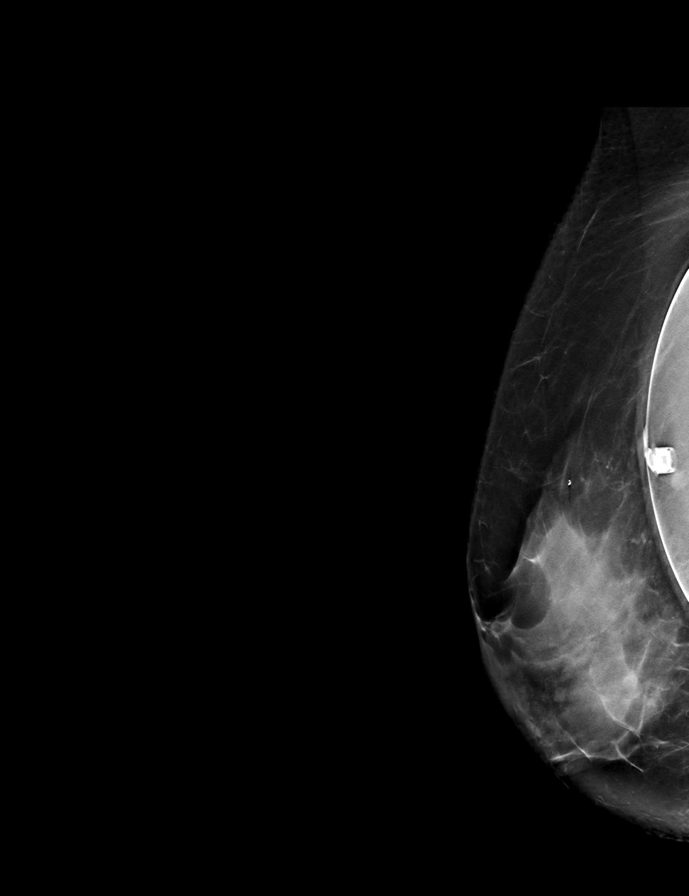

[L MLO (2 of 2)]
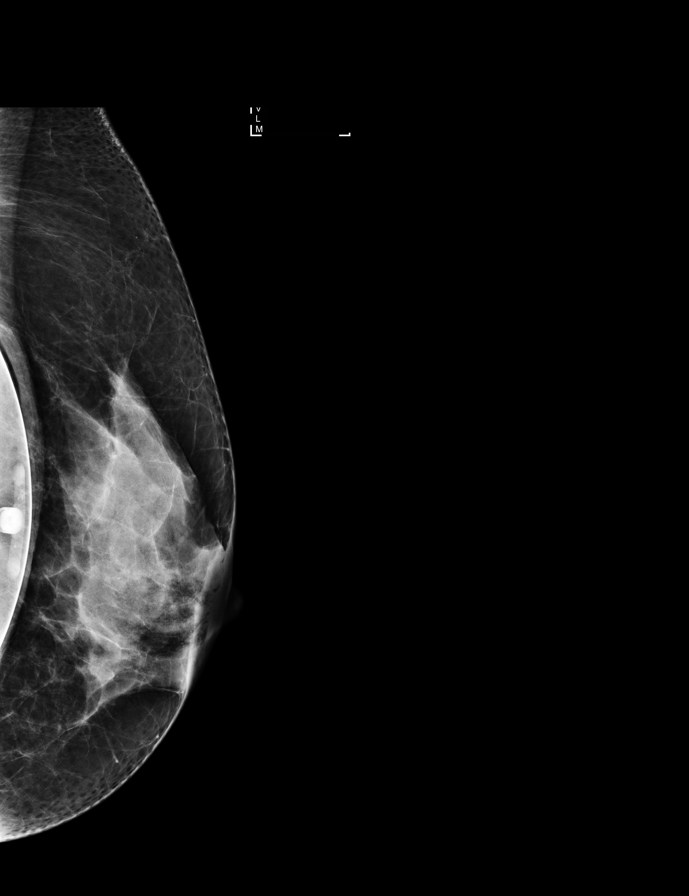

[9 of 32 positions shown; findings below may reference images not displayed]

ACR Breast Density Category c: The breast tissue is heterogeneously
dense, which may obscure small masses.
FINDINGS: There are no findings suspicious for malignancy. Images were
processed with CAD.
IMPRESSION: No mammographic evidence of malignancy. A result letter of this
screening mammogram will be mailed directly to the patient.

RECOMMENDATION:
Screening mammogram in one year. (Code:[facility]-G-IRI)

BI-RADS CATEGORY  1:  Negative.

## 2017-02-02 LAB — HM PAP SMEAR: HM Pap smear: NORMAL

## 2017-02-11 DIAGNOSIS — Z85828 Personal history of other malignant neoplasm of skin: Secondary | ICD-10-CM | POA: Diagnosis not present

## 2017-02-11 DIAGNOSIS — L811 Chloasma: Secondary | ICD-10-CM | POA: Diagnosis not present

## 2017-02-11 DIAGNOSIS — L578 Other skin changes due to chronic exposure to nonionizing radiation: Secondary | ICD-10-CM | POA: Diagnosis not present

## 2017-02-11 DIAGNOSIS — B353 Tinea pedis: Secondary | ICD-10-CM | POA: Diagnosis not present

## 2017-02-11 DIAGNOSIS — L910 Hypertrophic scar: Secondary | ICD-10-CM | POA: Diagnosis not present

## 2017-03-04 ENCOUNTER — Ambulatory Visit (INDEPENDENT_AMBULATORY_CARE_PROVIDER_SITE_OTHER): Payer: 59 | Admitting: Internal Medicine

## 2017-03-04 ENCOUNTER — Encounter: Payer: Self-pay | Admitting: Internal Medicine

## 2017-03-04 VITALS — BP 110/72 | HR 80 | Temp 97.9°F | Resp 15 | Ht 59.5 in | Wt 118.8 lb

## 2017-03-04 DIAGNOSIS — N951 Menopausal and female climacteric states: Secondary | ICD-10-CM | POA: Diagnosis not present

## 2017-03-04 DIAGNOSIS — Z01419 Encounter for gynecological examination (general) (routine) without abnormal findings: Secondary | ICD-10-CM | POA: Diagnosis not present

## 2017-03-04 DIAGNOSIS — E034 Atrophy of thyroid (acquired): Secondary | ICD-10-CM | POA: Diagnosis not present

## 2017-03-04 DIAGNOSIS — Z1231 Encounter for screening mammogram for malignant neoplasm of breast: Secondary | ICD-10-CM | POA: Diagnosis not present

## 2017-03-04 DIAGNOSIS — Z1239 Encounter for other screening for malignant neoplasm of breast: Secondary | ICD-10-CM

## 2017-03-04 DIAGNOSIS — F419 Anxiety disorder, unspecified: Secondary | ICD-10-CM | POA: Diagnosis not present

## 2017-03-04 MED ORDER — CLONAZEPAM 0.5 MG PO TABS: 0.2500 mg | ORAL_TABLET | Freq: Two times a day (BID) | ORAL | 2 refills | Status: DC | PRN

## 2017-03-04 NOTE — Progress Notes (Signed)
Subjective:  Patient ID: Joy Horton, female    DOB: 1961/02/06  Age: 56 y.o. MRN: [id number]  CC: The primary encounter diagnosis was Encounter for gynecological examination. Diagnoses of Breast cancer screening, Hypothyroidism due to acquired atrophy of thyroid, Menopausal vaginal dryness, and Anxiety were also pertinent to this visit.  HPI Joy Horton presents for follow up on hypothyroidism.  Last seen over one year ago.  Cc:  GAD   Caregiver stress and family discord /dysfunction :   Parents are 53 and 95,  Living [place] ,  Father had been bedb ound due to severe DJD hip,  Mom  Due to severe Aortic stenosis.  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   Father had successful hip replacement.  Mother passed way in May at home at 19 while patient was at work.  Selfish sister has Created a lot of mental anguish after her mother's death , so  Father was not able to have a small gathering to honor her  Life because of her antics.   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 Doing Utilization reviews , the only one on the weekends.  Increased anxiety , "had a meltdown"  Fatigue:  Feels  Tired,  Not sleeping well, can't fall asleep or stay asleep ,  Early morning waking with a bad feeling and a  list of things on her mind.   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   Feels overwhelmed by the administrative burden of work, on the weekends.  handling  parent's  Doctor visits and estate  In [place].    No panic attacks, not suicidal .  Appetite ok     Lab Results  Component Value Date   TSH 2.31 01/10/2017     Outpatient Medications Prior to Visit  Medication Sig Dispense Refill  . Multiple Vitamin (MULTIVITAMIN) tablet Take 1 tablet by mouth daily.      Marland Kitchen SYNTHROID 125 MCG tablet TAKE 1 TABLET (125 MCG TOTAL) BY  MOUTH DAILY. 90 tablet 1  . doxycycline (VIBRA-TABS) 100 MG tablet Take 1 tablet (100 mg total) by mouth 2 (two) times daily. (Patient not taking: Reported on 12/22/2015) 28 tablet 0   No facility-administered medications prior to visit.     Review of Systems;  Patient denies headache, fevers, malaise, unintentional weight loss, skin rash, eye pain, sinus congestion and sinus pain, sore throat, dysphagia,  hemoptysis , cough, dyspnea, wheezing, chest pain, palpitations, orthopnea, edema, abdominal pain, nausea, melena, diarrhea, constipation, flank pain, dysuria, hematuria, urinary  Frequency, nocturia, numbness, tingling, seizures,  Focal weakness, Loss of consciousness,  Tremor, insomnia, depression, anxiety, and suicidal ideation.      Objective:  BP 110/72 (BP Location: Left Arm, Patient Position: Sitting, Cuff Size: Normal)   Pulse 80   Temp 97.9 F (36.6 C) (Oral)   Resp 15   Ht 4' 11.5" (1.511 m)   Wt 118 lb 12.8 oz (53.9 kg)   SpO2 98%   BMI 23.59 kg/m   BP Readings from Last 3 Encounters:  03/04/17 110/72  12/22/15 102/72  12/02/15 102/84    Wt Readings from Last 3 Encounters:  03/04/17 118 lb 12.8 oz (53.9 kg)  12/22/15 117 lb 8 oz (53.3 kg)  12/02/15 118 lb (53.5 kg)    General appearance: alert, cooperative and appears stated age Ears: normal TM's and external ear canals both ears Throat: lips, mucosa, and tongue normal; teeth and gums normal Neck: no adenopathy, no carotid bruit, supple, symmetrical, trachea midline and thyroid not enlarged, symmetric, no tenderness/mass/nodules Back: symmetric, no curvature. ROM normal. No CVA tenderness. Lungs: clear to auscultation bilaterally Heart: regular rate and rhythm, S1, S2 normal, no murmur, click, rub or gallop Abdomen: soft, non-tender; bowel sounds normal; no masses,  no organomegaly Pulses: 2+ and symmetric Skin: Skin color, texture, turgor normal. No rashes or lesions Lymph nodes: Cervical, supraclavicular,  and axillary nodes normal.  No results found for: HGBA1C  Lab Results  Component Value Date   CREATININE 0.72 01/10/2017   CREATININE 0.72 01/05/2016   CREATININE 0.68 06/20/2015    Lab Results  Component Value Date   WBC 7.5 06/20/2015   HGB 13.1 06/20/2015   HCT 39.5 06/20/2015   PLT 369.0 06/20/2015   GLUCOSE 91 01/10/2017   CHOL 226 (H) 01/10/2017   TRIG 75.0 01/10/2017   HDL 61.90 01/10/2017   LDLCALC 149 (H) 01/10/2017   ALT 13 01/10/2017   AST 20 01/10/2017   NA 139 01/10/2017   K 3.9 01/10/2017   CL 103 01/10/2017   CREATININE 0.72 01/10/2017   BUN 21 01/10/2017   CO2 29 01/10/2017   TSH 2.31 01/10/2017    Mm Screening Breast W/implant Tomo Bilateral  Result Date: 03/26/2016 CLINICAL DATA:  Screening. EXAM: 2D DIGITAL SCREENING BILATERAL MAMMOGRAM WITH IMPLANTS, CAD AND ADJUNCT TOMO The patient has retropectoral implants. Standard and implant displaced views were performed. COMPARISON:  Previous exam(s). ACR Breast Density Category c: The breast tissue is heterogeneously dense, which may obscure small masses. FINDINGS: There are no findings suspicious for malignancy. Images were processed with CAD. IMPRESSION: No mammographic evidence of malignancy. A result letter of this screening mammogram will be mailed directly to the patient. RECOMMENDATION: Screening mammogram in one year. (Code:[id number]-B-01Y) BI-RADS CATEGORY  1:  Negative. Electronically Signed   By: Lovey Newcomer M.D.   On: 03/26/2016 14:03    Assessment & Plan:   Problem List Items Addressed This Visit    Anxiety    Secondary to family stressors aggravated by her mother's death.  She is having insomnia and increased irritability.  Clonazepam prescribed for insomnia . The risks and benefits of benzodiazepine use were discussed with patient today including excessive sedation leading to respiratory depression,  impaired thinking/driving, and addiction.  Patient was advised to avoid concurrent use with alcohol, to  use medication only as needed and not to share with others  .       Hypothyroidism    Thyroid function is WNL on current dose.  No current changes needed.   Lab Results  Component Value Date   TSH 2.31 01/10/2017         Menopausal vaginal dryness    Referral to Dr Garwin Brothers for annual pelvic exam and treatment of menopausal atrophic vaginitis/dryness        Other Visit Diagnoses    Encounter for gynecological examination    -  Primary   Relevant Orders   Ambulatory referral to Gynecology   Breast cancer screening       Relevant Orders   MM SCREENING BREAST TOMO BILATERAL     A total of 25 minutes of face to face time was spent with patient more than half of which was spent in counselling  about the above mentioned conditions  and coordination of care  I have discontinued Ms. Day's doxycycline. I am also having her start on clonazePAM. Additionally, I am having her maintain her multivitamin and SYNTHROID.  Meds ordered this encounter  Medications  . clonazePAM (KLONOPIN) 0.5 MG tablet    Sig: Take 0.5 tablets (0.25 mg total) by mouth 2 (two) times daily as needed for anxiety.    Dispense:  30 tablet    Refill:  2    Medications Discontinued During This Encounter  Medication Reason  . doxycycline (VIBRA-TABS) 100 MG tablet Patient has not taken in last 30 days    Follow-up: Return in about 6 months (around 09/01/2017).   Crecencio Mc, MD

## 2017-03-04 NOTE — Patient Instructions (Addendum)
I am prescribing clonazepam to use for your anxiety.  Take 1/2 tablet once or twice daily as needed . Ok to use for insomnia (full tablet  if needed )   Please schedule your annual exam with Dr Garwin Brothers.  I have made the referral  Non fasting labs in February

## 2017-03-06 DIAGNOSIS — F419 Anxiety disorder, unspecified: Secondary | ICD-10-CM | POA: Insufficient documentation

## 2017-03-06 NOTE — Assessment & Plan Note (Addendum)
Secondary to family stressors aggravated by her mother's death.  She is having insomnia and increased irritability.  Clonazepam prescribed for insomnia . The risks and benefits of benzodiazepine use were discussed with patient today including excessive sedation leading to respiratory depression,  impaired thinking/driving, and addiction.  Patient was advised to avoid concurrent use with alcohol, to use medication only as needed and not to share with others  .

## 2017-03-06 NOTE — Assessment & Plan Note (Signed)
Thyroid function is WNL on current dose.  No current changes needed.   Lab Results  Component Value Date   TSH 2.31 01/10/2017

## 2017-03-06 NOTE — Assessment & Plan Note (Signed)
Referral to Dr Garwin Brothers for annual pelvic exam and treatment of menopausal atrophic vaginitis/dryness

## 2017-03-09 ENCOUNTER — Encounter: Payer: Self-pay | Admitting: Physician Assistant

## 2017-03-09 ENCOUNTER — Ambulatory Visit: Payer: Self-pay | Admitting: Physician Assistant

## 2017-03-09 VITALS — BP 110/80 | HR 79 | Temp 98.0°F

## 2017-03-09 DIAGNOSIS — J020 Streptococcal pharyngitis: Secondary | ICD-10-CM

## 2017-03-09 MED ORDER — AMOXICILLIN 875 MG PO TABS: 875.0000 mg | ORAL_TABLET | Freq: Two times a day (BID) | ORAL | 0 refills | Status: DC

## 2017-03-09 NOTE — Progress Notes (Signed)
   Subjective:Sore throat/swollen glands    Patient ID: Joy Horton, female    DOB: September 26, 1960, 56 y.o.   MRN: [id number]  HPI Patient c/o sore throat for 2 days. Seen by Dentist yesterday and advided "white patches" posterior pharynx. Mild pain with swallowing. Denies fever/chill or N/V/D.   Review of Systems Negative except for compliant.    Objective:   Physical Exam HEENT remarkable for erythematous pharynx with exudate. Bilateral cervical adenopathy. Neck is supple. Lungs CTA and Heart RRR.       Assessment & Plan:Pharungitis  Amoxil and supportive care as directed. Follow with PCP.

## 2017-04-04 DIAGNOSIS — Z1151 Encounter for screening for human papillomavirus (HPV): Secondary | ICD-10-CM | POA: Diagnosis not present

## 2017-04-04 DIAGNOSIS — Z6824 Body mass index (BMI) 24.0-24.9, adult: Secondary | ICD-10-CM | POA: Diagnosis not present

## 2017-04-04 DIAGNOSIS — Z01419 Encounter for gynecological examination (general) (routine) without abnormal findings: Secondary | ICD-10-CM | POA: Diagnosis not present

## 2017-05-02 ENCOUNTER — Other Ambulatory Visit: Payer: Self-pay | Admitting: Internal Medicine

## 2017-05-13 ENCOUNTER — Ambulatory Visit
Admission: RE | Admit: 2017-05-13 | Discharge: 2017-05-13 | Disposition: A | Discharge status: Home / Self Care | Payer: 59 | Source: Ambulatory Visit | Attending: Internal Medicine | Admitting: Internal Medicine

## 2017-05-13 DIAGNOSIS — Z1239 Encounter for other screening for malignant neoplasm of breast: Secondary | ICD-10-CM

## 2017-05-13 DIAGNOSIS — Z1231 Encounter for screening mammogram for malignant neoplasm of breast: Secondary | ICD-10-CM | POA: Insufficient documentation

## 2017-05-13 DIAGNOSIS — Z9882 Breast implant status: Secondary | ICD-10-CM | POA: Diagnosis not present

## 2017-07-27 ENCOUNTER — Telehealth: Payer: Self-pay | Admitting: Internal Medicine

## 2017-07-27 DIAGNOSIS — E559 Vitamin D deficiency, unspecified: Secondary | ICD-10-CM

## 2017-07-27 DIAGNOSIS — E034 Atrophy of thyroid (acquired): Secondary | ICD-10-CM

## 2017-07-27 DIAGNOSIS — E785 Hyperlipidemia, unspecified: Secondary | ICD-10-CM

## 2017-07-27 DIAGNOSIS — Z79899 Other long term (current) drug therapy: Secondary | ICD-10-CM

## 2017-07-27 NOTE — Telephone Encounter (Signed)
Ordered lipid panel, cmp, tsh, and vitamin d. Is there anything else that needs to be ordered?

## 2017-07-27 NOTE — Telephone Encounter (Signed)
Thanks

## 2017-07-27 NOTE — Telephone Encounter (Signed)
More Detail >> FW: Appointment Request [place] Loop Sent: Wed July 27, 2017 12:26 PM To: [place] MRN: [id number] DOB: 02-02-1961 Pt Home: [id number]   Entered: [id number]     Message     ----- Message ----- From: Joy Horton Sent: 07/27/2017 12:21 PM To: Madelyn Brunner Pool Subject: Appointment Request               ----- Message from [place], Generic sent at 07/27/2017 12:21 PM EST -----  Appointment Request From: Joy Horton  With Provider: Crecencio Mc, MD [facility] [place]]  Preferred Date Range: 07/29/2017 - 08/04/2017  Preferred Times: Any time  Reason for visit: Request an Appointment  Comments: Have scheduled appointment with DrTullo on 3.29.19.  Requesting labs prior to appointment . Will run out of synthroid on 3.2.19. Anyday or time except 2.27.19. Thanks [employer]

## 2017-07-29 ENCOUNTER — Other Ambulatory Visit: Payer: Self-pay | Admitting: Internal Medicine

## 2017-08-02 NOTE — Telephone Encounter (Signed)
Scheduled pt for a fasting lab appt. Pt is aware of appt date and time.

## 2017-08-05 ENCOUNTER — Other Ambulatory Visit (INDEPENDENT_AMBULATORY_CARE_PROVIDER_SITE_OTHER): Payer: 59

## 2017-08-05 DIAGNOSIS — Z79899 Other long term (current) drug therapy: Secondary | ICD-10-CM | POA: Diagnosis not present

## 2017-08-05 DIAGNOSIS — E559 Vitamin D deficiency, unspecified: Secondary | ICD-10-CM | POA: Diagnosis not present

## 2017-08-05 DIAGNOSIS — E034 Atrophy of thyroid (acquired): Secondary | ICD-10-CM | POA: Diagnosis not present

## 2017-08-05 DIAGNOSIS — E785 Hyperlipidemia, unspecified: Secondary | ICD-10-CM

## 2017-08-05 LAB — COMPREHENSIVE METABOLIC PANEL
ALT: 13 U/L (ref 0–35)
AST: 19 U/L (ref 0–37)
Albumin: 4.1 g/dL (ref 3.5–5.2)
Alkaline Phosphatase: 54 U/L (ref 39–117)
BILIRUBIN TOTAL: 0.4 mg/dL (ref 0.2–1.2)
BUN: 18 mg/dL (ref 6–23)
CALCIUM: 9.4 mg/dL (ref 8.4–10.5)
CHLORIDE: 104 meq/L (ref 96–112)
CO2: 31 meq/L (ref 19–32)
Creatinine, Ser: 0.67 mg/dL (ref 0.40–1.20)
GFR: 95.82 mL/min (ref >60.00)
Glucose, Bld: 95 mg/dL (ref 70–99)
Potassium: 4.2 mEq/L (ref 3.5–5.1)
Sodium: 141 mEq/L (ref 135–145)
Total Protein: 7.1 g/dL (ref 6.0–8.3)

## 2017-08-05 LAB — LIPID PANEL
CHOL/HDL RATIO: 4
Cholesterol: 222 mg/dL — ABNORMAL HIGH (ref 0–200)
HDL: 50.8 mg/dL (ref >39.00)
LDL CALC: 152 mg/dL — AB (ref 0–99)
NONHDL: 171.14
Triglycerides: 98 mg/dL (ref 0.0–149.0)
VLDL: 19.6 mg/dL (ref 0.0–40.0)

## 2017-08-05 LAB — VITAMIN D 25 HYDROXY (VIT D DEFICIENCY, FRACTURES): VITD: 32.08 ng/mL (ref 30.00–100.00)

## 2017-08-05 LAB — TSH: TSH: 1.62 u[IU]/mL (ref 0.35–4.50)

## 2017-08-24 ENCOUNTER — Encounter: Payer: Self-pay | Admitting: Internal Medicine

## 2017-08-24 ENCOUNTER — Ambulatory Visit: Payer: 59 | Admitting: Internal Medicine

## 2017-08-24 VITALS — BP 118/84 | HR 78 | Temp 98.1°F | Resp 15 | Ht 59.5 in | Wt 123.4 lb

## 2017-08-24 DIAGNOSIS — R6889 Other general symptoms and signs: Secondary | ICD-10-CM

## 2017-08-24 DIAGNOSIS — J0110 Acute frontal sinusitis, unspecified: Secondary | ICD-10-CM

## 2017-08-24 LAB — POCT INFLUENZA A/B
Influenza A, POC: NEGATIVE
Influenza B, POC: NEGATIVE

## 2017-08-24 MED ORDER — PREDNISONE 10 MG PO TABS: ORAL_TABLET | ORAL | 0 refills | Status: DC

## 2017-08-24 MED ORDER — AMOXICILLIN-POT CLAVULANATE 875-125 MG PO TABS: 1.0000 | ORAL_TABLET | Freq: Two times a day (BID) | ORAL | 0 refills | Status: DC

## 2017-08-24 NOTE — Progress Notes (Signed)
Subjective:  Patient ID: Joy Horton, female    DOB: December 24, 1960  Age: 57 y.o. MRN: [id number]  CC: The encounter diagnosis was Flu-like symptoms.  HPI [name] presents for  Illness  Left eye conjunctivitis  One month ago.  Mild,  Never had purulent drainage.    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  Had recurrent episodes of diarrhea that  lasted 48 hours. Felt better for a few days,  Then last week developed a subjective feeling of generalized weakness,  Spent the day in bed. Developed cervical LAD,  Chest cold,  myalgias. Took advil and tylenol round the clock for body aches   Chills and sweats.  Sneezing started on Monday . Some post nasal drip . Nothing tastes right. Left frontal sinus pain,  Left Eye hurts,    Husband had a URI   Outpatient Medications Prior to Visit  Medication Sig Dispense Refill  . Multiple Vitamin (MULTIVITAMIN) tablet Take 1 tablet by mouth daily.      Marland Kitchen SYNTHROID 125 MCG tablet TAKE 1 TABLET BY MOUTH DAILY. 90 tablet 0  . clonazePAM (KLONOPIN) 0.5 MG tablet Take 0.5 tablets (0.25 mg total) by mouth 2 (two) times daily as needed for anxiety. (Patient not taking: Reported on 08/24/2017) 30 tablet 2  . amoxicillin (AMOXIL) 875 MG tablet Take 1 tablet (875 mg total) by mouth 2 (two) times daily. (Patient not taking: Reported on 08/24/2017) 20 tablet 0   No facility-administered medications prior to visit.     Review of Systems;  Patient denies headache, fevers, malaise, unintentional weight loss, skin rash, eye pain, sinus congestion and sinus pain, sore throat, dysphagia,  hemoptysis , cough, dyspnea, wheezing, chest pain, palpitations, orthopnea, edema, abdominal pain, nausea, melena, diarrhea, constipation, flank pain, dysuria, hematuria, urinary  Frequency, nocturia, numbness, tingling, seizures,  Focal weakness, Loss of consciousness,  Tremor, insomnia, depression, anxiety, and suicidal ideation.       Objective:  BP 118/84 (BP Location: Left Arm, Patient Position: Sitting, Cuff Size: Normal)   Pulse 78   Temp 98.1 F (36.7 C) (Oral)   Resp 15   Ht 4' 11.5" (1.511 m)   Wt 123 lb 6.4 oz (56 kg)   SpO2 98%   BMI 24.51 kg/m   BP Readings from Last 3 Encounters:  08/24/17 118/84  03/09/17 110/80  03/04/17 110/72    Wt Readings from Last 3 Encounters:  08/24/17 123 lb 6.4 oz (56 kg)  03/04/17 118 lb 12.8 oz (53.9 kg)  12/22/15 117 lb 8 oz (53.3 kg)    General appearance: alert, cooperative and appears stated age Ears: normal TM's and external ear canals both ears Eyes: EOMI, no conjuncitivitis . Eyeball not painful to palpation Sinuses:  nontender to palpation  Throat: lips, mucosa, and tongue normal; teeth and gums normal Neck: cervical  adenopathy, no carotid bruit, supple, symmetrical, trachea midline and thyroid not enlarged, symmetric, no tenderness/mass/nodules Back: symmetric, no curvature. ROM normal. No CVA tenderness. Lungs: clear to auscultation bilaterally Heart: regular rate and rhythm, S1, S2 normal, no murmur, click, rub or gallop Abdomen: soft, non-tender; bowel sounds normal; no masses,  no organomegaly Pulses: 2+ and symmetric Skin: Skin color, texture, turgor normal. No rashes or lesions Lymph nodes: Cervical, supraclavicular, and axillary nodes normal.  No results found for: HGBA1C  Lab Results  Component Value Date   CREATININE 0.67 08/05/2017   CREATININE 0.72 01/10/2017  CREATININE 0.72 01/05/2016    Lab Results  Component Value Date   WBC 7.5 06/20/2015   HGB 13.1 06/20/2015   HCT 39.5 06/20/2015   PLT 369.0 06/20/2015   GLUCOSE 95 08/05/2017   CHOL 222 (H) 08/05/2017   TRIG 98.0 08/05/2017   HDL 50.80 08/05/2017   LDLCALC 152 (H) 08/05/2017   ALT 13 08/05/2017   AST 19 08/05/2017   NA 141 08/05/2017   K 4.2 08/05/2017   CL 104 08/05/2017   CREATININE 0.67 08/05/2017   BUN 18 08/05/2017   CO2 31 08/05/2017   TSH 1.62  08/05/2017    Assessment & Plan:   Problem List Items Addressed This Visit    Flu-like symptoms - Primary    Now with frontal sinus pressure without pain.  Left side.    Steroids,  Decongestants, add empiric abx if symptoms worsen.  Probiotic advised if abx used. .  Flu test negative.       Relevant Orders   POCT Influenza A/B (Completed)      I have discontinued Shemaiah P. Behanna's amoxicillin. I am also having her start on predniSONE and amoxicillin-clavulanate. Additionally, I am having her maintain her multivitamin, clonazePAM, and SYNTHROID.  Meds ordered this encounter  Medications  . predniSONE (DELTASONE) 10 MG tablet    Sig: 6 tablets on Day 1 , then reduce by 1 tablet daily until gone    Dispense:  21 tablet    Refill:  0  . amoxicillin-clavulanate (AUGMENTIN) 875-125 MG tablet    Sig: Take 1 tablet by mouth 2 (two) times daily.    Dispense:  14 tablet    Refill:  0    Medications Discontinued During This Encounter  Medication Reason  . amoxicillin (AMOXIL) 875 MG tablet Completed Course    Follow-up: No follow-ups on file.   Crecencio Mc, MD

## 2017-08-24 NOTE — Patient Instructions (Addendum)
Start the prednisone taper  Add sudafed PE if needed for congestion,  Benadryl at bedtime for Post nasal drip   Add augmentin if facial pain or ear pain develops   If you start the augmentin,  I advise you to take a probiotic for 3 weeks   Align culturelle ,  florastar  Or floraque

## 2017-08-25 DIAGNOSIS — R6889 Other general symptoms and signs: Secondary | ICD-10-CM | POA: Insufficient documentation

## 2017-08-25 NOTE — Assessment & Plan Note (Addendum)
Now with frontal sinus pressure without pain.  Left side.    Steroids,  Decongestants, add empiric abx if symptoms worsen.  Probiotic advised if abx used. .  Flu test negative.

## 2017-09-02 ENCOUNTER — Ambulatory Visit: Payer: 59 | Admitting: Internal Medicine

## 2017-09-02 DIAGNOSIS — H938X2 Other specified disorders of left ear: Secondary | ICD-10-CM

## 2017-09-02 DIAGNOSIS — R0981 Nasal congestion: Secondary | ICD-10-CM

## 2017-09-02 DIAGNOSIS — R6889 Other general symptoms and signs: Secondary | ICD-10-CM | POA: Diagnosis not present

## 2017-09-02 MED ORDER — PREDNISONE 20 MG PO TABS: 20.0000 mg | ORAL_TABLET | Freq: Every day | ORAL | 0 refills | Status: DC

## 2017-09-02 NOTE — Progress Notes (Signed)
Subjective:  Patient ID: Joy Horton, female    DOB: 1961/03/28  Age: 57 y.o. MRN: [id number]  CC: The encounter diagnosis was Flu-like symptoms.  HPI SELINE ENZOR presents for follow up on flu like symptoms with sinusitis .  Treated on march 20 with steroids and decongestants.  Never started the antibiotics . symptoms are improving steadily,  Ear still feels full without pain , drainage or decreased hearing.   Outpatient Medications Prior to Visit  Medication Sig Dispense Refill  . Multiple Vitamin (MULTIVITAMIN) tablet Take 1 tablet by mouth daily.      Marland Kitchen SYNTHROID 125 MCG tablet TAKE 1 TABLET BY MOUTH DAILY. 90 tablet 0  . amoxicillin-clavulanate (AUGMENTIN) 875-125 MG tablet Take 1 tablet by mouth 2 (two) times daily. (Patient not taking: Reported on 09/02/2017) 14 tablet 0  . clonazePAM (KLONOPIN) 0.5 MG tablet Take 0.5 tablets (0.25 mg total) by mouth 2 (two) times daily as needed for anxiety. (Patient not taking: Reported on 08/24/2017) 30 tablet 2  . predniSONE (DELTASONE) 10 MG tablet 6 tablets on Day 1 , then reduce by 1 tablet daily until gone (Patient not taking: Reported on 09/02/2017) 21 tablet 0   No facility-administered medications prior to visit.     Review of Systems;  Patient denies headache, fevers, malaise, unintentional weight loss, skin rash, eye pain, sinus congestion and sinus pain, sore throat, dysphagia,  hemoptysis , cough, dyspnea, wheezing, chest pain, palpitations, orthopnea, edema, abdominal pain, nausea, melena, diarrhea, constipation, flank pain, dysuria, hematuria, urinary  Frequency, nocturia, numbness, tingling, seizures,  Focal weakness, Loss of consciousness,  Tremor, insomnia, depression, anxiety, and suicidal ideation.      Objective:  BP 116/78 (BP Location: Left Arm, Patient Position: Sitting, Cuff Size: Normal)   Pulse 82   Temp 97.8 F (36.6 C) (Oral)   Resp 18   Wt 121 lb 3.2 oz (55 kg)   SpO2 98%   BMI 24.07 kg/m   BP  Readings from Last 3 Encounters:  09/02/17 116/78  08/24/17 118/84  03/09/17 110/80    Wt Readings from Last 3 Encounters:  09/02/17 121 lb 3.2 oz (55 kg)  08/24/17 123 lb 6.4 oz (56 kg)  03/04/17 118 lb 12.8 oz (53.9 kg)    General appearance: alert, cooperative and appears stated age Ears: normal TM's and external ear canals both ears Throat: lips, mucosa, and tongue normal; teeth and gums normal Neck: no adenopathy, no carotid bruit, supple, symmetrical, trachea midline and thyroid not enlarged, symmetric, no tenderness/mass/nodules Back: symmetric, no curvature. ROM normal. No CVA tenderness. Lungs: clear to auscultation bilaterally Heart: regular rate and rhythm, S1, S2 normal, no murmur, click, rub or gallop Abdomen: soft, non-tender; bowel sounds normal; no masses,  no organomegaly Pulses: 2+ and symmetric Skin: Skin color, texture, turgor normal. No rashes or lesions Lymph nodes: Cervical, supraclavicular, and axillary nodes normal.  No results found for: HGBA1C  Lab Results  Component Value Date   CREATININE 0.67 08/05/2017   CREATININE 0.72 01/10/2017   CREATININE 0.72 01/05/2016    Lab Results  Component Value Date   WBC 7.5 06/20/2015   HGB 13.1 06/20/2015   HCT 39.5 06/20/2015   PLT 369.0 06/20/2015   GLUCOSE 95 08/05/2017   CHOL 222 (H) 08/05/2017   TRIG 98.0 08/05/2017   HDL 50.80 08/05/2017   LDLCALC 152 (H) 08/05/2017   ALT 13 08/05/2017   AST 19 08/05/2017   NA 141 08/05/2017   K 4.2 08/05/2017  CL 104 08/05/2017   CREATININE 0.67 08/05/2017   BUN 18 08/05/2017   CO2 31 08/05/2017   TSH 1.62 08/05/2017    Mm Screening Breast W/implant Tomo Bilateral  Result Date: 05/13/2017 CLINICAL DATA:  Screening. EXAM: 2D DIGITAL SCREENING BILATERAL MAMMOGRAM WITH IMPLANTS, CAD AND ADJUNCT TOMO The patient has retropectoral implants. Standard and implant displaced views were performed. COMPARISON:  Previous exam(s). ACR Breast Density Category c: The  breast tissue is heterogeneously dense, which may obscure small masses. FINDINGS: There are no findings suspicious for malignancy. Images were processed with CAD. IMPRESSION: No mammographic evidence of malignancy. A result letter of this screening mammogram will be mailed directly to the patient. RECOMMENDATION: Screening mammogram in one year. (Code:SM-B-01Y) BI-RADS CATEGORY  1:  Negative. Electronically Signed   By: Lajean Manes M.D.   On: 05/13/2017 12:48    Assessment & Plan:   Problem List Items Addressed This Visit    Flu-like symptoms    Symptoms have improved without use of antibiotics but he has persistent fullness in the  Left ear  And sinus congestion.   No signs of otitis on exam.  Will repeat 5 day course of prednisone 20 mg daily , and continue decongestant          I have discontinued Janey P. Lunney's clonazePAM, predniSONE, and amoxicillin-clavulanate. I am also having her start on predniSONE. Additionally, I am having her maintain her multivitamin and SYNTHROID.  Meds ordered this encounter  Medications  . predniSONE (DELTASONE) 20 MG tablet    Sig: Take 1 tablet (20 mg total) by mouth daily with breakfast.    Dispense:  5 tablet    Refill:  0    Medications Discontinued During This Encounter  Medication Reason  . amoxicillin-clavulanate (AUGMENTIN) 875-125 MG tablet   . clonazePAM (KLONOPIN) 0.5 MG tablet   . predniSONE (DELTASONE) 10 MG tablet     Follow-up: Return in about 6 months (around 03/05/2018) for CPE.   Crecencio Mc, MD

## 2017-09-02 NOTE — Patient Instructions (Addendum)
Sudafed PE 10 mg every 6 hours , omit evening dose   prednisone 20 mg daily for 5 days

## 2017-09-04 ENCOUNTER — Encounter: Payer: Self-pay | Admitting: Internal Medicine

## 2017-09-04 NOTE — Assessment & Plan Note (Signed)
Symptoms have improved without use of antibiotics but he has persistent fullness in the  Left ear  And sinus congestion.   No signs of otitis on exam.  Will repeat 5 day course of prednisone 20 mg daily , and continue decongestant

## 2017-11-08 ENCOUNTER — Other Ambulatory Visit: Payer: Self-pay | Admitting: Internal Medicine

## 2017-11-25 ENCOUNTER — Ambulatory Visit: Payer: Self-pay | Admitting: Family Medicine

## 2017-11-25 NOTE — Progress Notes (Signed)
Patient did not need physical

## 2018-02-13 DIAGNOSIS — Z1283 Encounter for screening for malignant neoplasm of skin: Secondary | ICD-10-CM | POA: Diagnosis not present

## 2018-02-13 DIAGNOSIS — L811 Chloasma: Secondary | ICD-10-CM | POA: Diagnosis not present

## 2018-02-13 DIAGNOSIS — L853 Xerosis cutis: Secondary | ICD-10-CM | POA: Diagnosis not present

## 2018-02-13 DIAGNOSIS — D2371 Other benign neoplasm of skin of right lower limb, including hip: Secondary | ICD-10-CM | POA: Diagnosis not present

## 2018-02-13 DIAGNOSIS — Z85828 Personal history of other malignant neoplasm of skin: Secondary | ICD-10-CM | POA: Diagnosis not present

## 2018-02-13 DIAGNOSIS — D2262 Melanocytic nevi of left upper limb, including shoulder: Secondary | ICD-10-CM | POA: Diagnosis not present

## 2018-02-13 DIAGNOSIS — L720 Epidermal cyst: Secondary | ICD-10-CM | POA: Diagnosis not present

## 2018-02-13 DIAGNOSIS — L578 Other skin changes due to chronic exposure to nonionizing radiation: Secondary | ICD-10-CM | POA: Diagnosis not present

## 2018-02-13 DIAGNOSIS — D485 Neoplasm of uncertain behavior of skin: Secondary | ICD-10-CM | POA: Diagnosis not present

## 2018-05-22 ENCOUNTER — Other Ambulatory Visit: Payer: Self-pay | Admitting: Internal Medicine

## 2018-07-04 ENCOUNTER — Other Ambulatory Visit: Payer: Self-pay | Admitting: Internal Medicine

## 2018-07-04 DIAGNOSIS — Z1231 Encounter for screening mammogram for malignant neoplasm of breast: Secondary | ICD-10-CM

## 2018-07-12 DIAGNOSIS — Z1151 Encounter for screening for human papillomavirus (HPV): Secondary | ICD-10-CM | POA: Diagnosis not present

## 2018-07-12 DIAGNOSIS — Z6825 Body mass index (BMI) 25.0-25.9, adult: Secondary | ICD-10-CM | POA: Diagnosis not present

## 2018-07-12 DIAGNOSIS — Z01419 Encounter for gynecological examination (general) (routine) without abnormal findings: Secondary | ICD-10-CM | POA: Diagnosis not present

## 2018-12-04 ENCOUNTER — Telehealth: Payer: Self-pay | Admitting: Internal Medicine

## 2018-12-04 DIAGNOSIS — E559 Vitamin D deficiency, unspecified: Secondary | ICD-10-CM

## 2018-12-04 DIAGNOSIS — Z0000 Encounter for general adult medical examination without abnormal findings: Secondary | ICD-10-CM

## 2018-12-04 DIAGNOSIS — E034 Atrophy of thyroid (acquired): Secondary | ICD-10-CM

## 2018-12-04 NOTE — Telephone Encounter (Signed)
Patient is requesting lab orders for her thyroid check Please advise thank you. [phone] Patient states she can be notified via [facility] when orders are placed and can schedule labs.

## 2018-12-04 NOTE — Telephone Encounter (Signed)
Pt has not been seen since 08/2017 and has not had labs since 08/2017.

## 2018-12-04 NOTE — Telephone Encounter (Signed)
Fasting labs ordered,  Please schedule patient for a virtula visit or an in house CPE

## 2018-12-06 NOTE — Telephone Encounter (Signed)
Mychart message has been sent letting pt know to schedule appts per pt's request.

## 2018-12-07 ENCOUNTER — Other Ambulatory Visit: Payer: Self-pay | Admitting: Internal Medicine

## 2018-12-29 ENCOUNTER — Other Ambulatory Visit (INDEPENDENT_AMBULATORY_CARE_PROVIDER_SITE_OTHER): Payer: 59

## 2018-12-29 ENCOUNTER — Other Ambulatory Visit: Payer: Self-pay

## 2018-12-29 DIAGNOSIS — E034 Atrophy of thyroid (acquired): Secondary | ICD-10-CM

## 2018-12-29 DIAGNOSIS — Z0000 Encounter for general adult medical examination without abnormal findings: Secondary | ICD-10-CM | POA: Diagnosis not present

## 2018-12-29 DIAGNOSIS — E559 Vitamin D deficiency, unspecified: Secondary | ICD-10-CM

## 2018-12-29 LAB — COMPREHENSIVE METABOLIC PANEL
ALT: 15 U/L (ref 0–35)
AST: 20 U/L (ref 0–37)
Albumin: 4.6 g/dL (ref 3.5–5.2)
Alkaline Phosphatase: 62 U/L (ref 39–117)
BUN: 19 mg/dL (ref 6–23)
CO2: 30 mEq/L (ref 19–32)
Calcium: 9.6 mg/dL (ref 8.4–10.5)
Chloride: 102 mEq/L (ref 96–112)
Creatinine, Ser: 0.58 mg/dL (ref 0.40–1.20)
GFR: 105.98 mL/min (ref >60.00)
Glucose, Bld: 79 mg/dL (ref 70–99)
Potassium: 3.9 mEq/L (ref 3.5–5.1)
Sodium: 140 mEq/L (ref 135–145)
Total Bilirubin: 0.5 mg/dL (ref 0.2–1.2)
Total Protein: 6.7 g/dL (ref 6.0–8.3)

## 2018-12-29 LAB — LIPID PANEL
Cholesterol: 224 mg/dL — ABNORMAL HIGH (ref 0–200)
HDL: 51 mg/dL (ref >39.00)
LDL Cholesterol: 139 mg/dL — ABNORMAL HIGH (ref 0–99)
NonHDL: 173.44
Total CHOL/HDL Ratio: 4
Triglycerides: 171 mg/dL — ABNORMAL HIGH (ref 0.0–149.0)
VLDL: 34.2 mg/dL (ref 0.0–40.0)

## 2018-12-29 LAB — VITAMIN D 25 HYDROXY (VIT D DEFICIENCY, FRACTURES): VITD: 31.63 ng/mL (ref 30.00–100.00)

## 2018-12-29 LAB — TSH: TSH: 0.06 u[IU]/mL — ABNORMAL LOW (ref 0.35–4.50)

## 2018-12-30 ENCOUNTER — Other Ambulatory Visit: Payer: Self-pay | Admitting: Internal Medicine

## 2018-12-30 MED ORDER — LEVOTHYROXINE SODIUM 112 MCG PO TABS: 112.0000 ug | ORAL_TABLET | Freq: Every day | ORAL | 3 refills | Status: DC

## 2019-01-05 ENCOUNTER — Other Ambulatory Visit: Payer: Self-pay

## 2019-01-05 ENCOUNTER — Encounter: Payer: Self-pay | Admitting: Internal Medicine

## 2019-01-05 ENCOUNTER — Ambulatory Visit (INDEPENDENT_AMBULATORY_CARE_PROVIDER_SITE_OTHER): Payer: 59 | Admitting: Internal Medicine

## 2019-01-05 DIAGNOSIS — E034 Atrophy of thyroid (acquired): Secondary | ICD-10-CM

## 2019-01-05 DIAGNOSIS — Z1211 Encounter for screening for malignant neoplasm of colon: Secondary | ICD-10-CM | POA: Diagnosis not present

## 2019-01-05 DIAGNOSIS — Z0000 Encounter for general adult medical examination without abnormal findings: Secondary | ICD-10-CM | POA: Diagnosis not present

## 2019-01-05 NOTE — Progress Notes (Signed)
Telephone  Note  This visit type was conducted due to national recommendations for restrictions regarding the COVID-19 pandemic (e.g. social distancing).  This format is felt to be most appropriate for this patient at this time.  All issues noted in this document were discussed and addressed.  No physical exam was performed (except for noted visual exam findings with Video Visits).   I connected with@ on 01/05/19 at  8:30 AM EDT by  telephone and verified that I am speaking with the correct person using two identifiers. Location patient: home Location provider: work or home office Persons participating in the virtual visit: patient, provider  I discussed the limitations, risks, security and privacy concerns of performing an evaluation and management service by telephone and the availability of in person appointments. I also discussed with the patient that there may be a patient responsible charge related to this service. The patient expressed understanding and agreed to proceed.  Reason for visit: : follow up on hypothyroidism,   HPI:  58 yr old female with hypothyroidism , GAD last seen March 2019  Recent labs reviewed.   1) Hypothyroid TSH was suppressed  And dose was reduced from 125 to 112 mcg on July 26 .  She states that she felt her thyroid had been overactive for a while but COVID KEPT HER FROM REQUESTING LABS earlier.  She describes extreme fatigue with moderate activities,  And had an episode of chest pain , chest tightness, and has had racing pulse at times, and foot pain in the morning along with episodic pain in the right wrist (sharp jabbing pain with rotation of wrist) Very mild numbness in the fingers at night.  Hair falling out more . 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  Craving coffee. Using unisom prn.   3) morning anxiety   ROS: Patient denies  headache, fevers, malaise, unintentional weight loss, skin rash, eye pain, sinus congestion and sinus pain, sore throat, dysphagia,  hemoptysis , cough, dyspnea, wheezing, chest pain, palpitations, orthopnea, edema, abdominal pain, nausea, melena, diarrhea, constipation, flank pain, dysuria, hematuria, urinary  Frequency, nocturia, numbness, tingling, seizures,  Focal weakness, Loss of consciousness,  Tremor, insomnia, depression, anxiety, and suicidal ideation.      Past Medical History:  Diagnosis Date  . GERD (gastroesophageal reflux disease)    infrequent  . hypothyroid     Past Surgical History:  Procedure Laterality Date  . AUGMENTATION MAMMAPLASTY      Family History  Problem Relation Age of Onset  . Diabetes Mother   . Arthritis Mother     SOCIAL HX:  reports that she has never smoked. She has never used smokeless tobacco. She reports current alcohol use of about 5.0 standard drinks of alcohol per week. She reports that she does not use drugs.   Current Outpatient Medications:  .  levothyroxine (SYNTHROID) 112 MCG tablet, Take 1 tablet (112 mcg total) by mouth daily., Disp: 90 tablet, Rfl: 3 .  Multiple Vitamin (MULTIVITAMIN) tablet, Take 1 tablet by mouth daily.  , Disp: , Rfl:   EXAM:   General impression: alert, cooperative and articulate.  No signs of being in distress  Lungs: speech is fluent sentence length suggests that patient is not short of breath and not punctuated by cough, sneezing or sniffing. Marland Kitchen   Psych: affect normal.  speech is  articulate and non pressured .  Denies suicidal thoughts   ASSESSMENT AND PLAN:   Hypothyroidism Has been feeling fatigued lately,  Hair loss,  Short of breath, thyroid overactive..  Dose reduced to 112 mcg daily on juy 25 Lab Results  Component Value Date   TSH 0.06 (L) 12/29/2018     Routine general medical examination at a health care facility age appropriate education and counseling updated, referrals for preventative  services and immunizations addressed, dietary and smoking counseling addressed, most recent labs reviewed.  I have personally reviewed and have noted:  1) the patient's medical and social history 2) The pt's use of alcohol, tobacco, and illicit drugs 3) The patient's current medications and supplements 4) Functional ability including ADL's, fall risk, home safety risk, hearing and visual impairment 5) Diet and physical activities 6) Evidence for depression or mood disorder 7) The patient's height, weight, and BMI have been recorded in the chart  I have made referrals, and provided counseling and education based on review of the above    I discussed the assessment and treatment plan with the patient. The patient was provided an opportunity to ask questions and all were answered. The patient agreed with the plan and demonstrated an understanding of the instructions.   The patient was advised to call back or seek an in-person evaluation if the symptoms worsen or if the condition fails to improve as anticipated.  I provided  25 minutes of non-face-to-face time during this encounter.   Crecencio Mc, MD

## 2019-01-07 NOTE — Assessment & Plan Note (Signed)
Has been feeling fatigued lately,  Hair loss,  Short of breath, thyroid overactive..  Dose reduced to 112 mcg daily on juy 25 Lab Results  Component Value Date   TSH 0.06 (L) 12/29/2018

## 2019-01-07 NOTE — Assessment & Plan Note (Signed)

## 2019-01-26 ENCOUNTER — Other Ambulatory Visit (INDEPENDENT_AMBULATORY_CARE_PROVIDER_SITE_OTHER): Payer: 59

## 2019-01-26 ENCOUNTER — Other Ambulatory Visit: Payer: Self-pay

## 2019-01-26 ENCOUNTER — Ambulatory Visit (INDEPENDENT_AMBULATORY_CARE_PROVIDER_SITE_OTHER): Payer: 59 | Admitting: Internal Medicine

## 2019-01-26 ENCOUNTER — Encounter: Payer: Self-pay | Admitting: Internal Medicine

## 2019-01-26 VITALS — Ht 59.5 in | Wt 123.0 lb

## 2019-01-26 DIAGNOSIS — E0590 Thyrotoxicosis, unspecified without thyrotoxic crisis or storm: Secondary | ICD-10-CM | POA: Diagnosis not present

## 2019-01-26 DIAGNOSIS — E039 Hypothyroidism, unspecified: Secondary | ICD-10-CM | POA: Diagnosis not present

## 2019-01-26 LAB — TSH: TSH: 0.06 u[IU]/mL — ABNORMAL LOW (ref 0.35–4.50)

## 2019-01-26 LAB — T4, FREE: Free T4: 1.36 ng/dL (ref 0.60–1.60)

## 2019-01-26 MED ORDER — METOPROLOL TARTRATE 25 MG PO TABS: 12.5000 mg | ORAL_TABLET | Freq: Two times a day (BID) | ORAL | 0 refills | Status: DC

## 2019-01-26 NOTE — Telephone Encounter (Signed)
Spoke with pt and she has been scheduled for a virtual visit with Dr. Derrel Nip today at [facility]. Pt is aware of appt date and time.

## 2019-01-26 NOTE — Progress Notes (Signed)
Virtual Visit via [email or web address]  This visit type was conducted due to national recommendations for restrictions regarding the COVID-19 pandemic (e.g. social distancing).  This format is felt to be most appropriate for this patient at this time.  All issues noted in this document were discussed and addressed.  No physical exam was performed (except for noted visual exam findings with Video Visits).   I connected with@ on 01/26/19 at 10:00 AM EDT by a video enabled telemedicine application or telephone and verified that I am speaking with the correct person using two identifiers. Location patient: home Location provider: work or home office Persons participating in the virtual visit: patient, provider  I discussed the limitations, risks, security and privacy concerns of performing an evaluation and management service by telephone and the availability of in person appointments. I also discussed with the patient that there may be a patient responsible charge related to this service. The patient expressed understanding and agreed to proceed.  Reason for visit: follow up  HPI:  58 yr old with hypothyroidism,  Treated with levothyroxine for over 8 years presents with persistent symptoms of hyperthyroidism after reducing levothyroxine dose  On July 25 to 112 mcg  due to suppressed TSH.  Patient states that the  palpitations and fatigue improved initially for the first two weeks after the dose reduction for returned about 2 weeks ago ( palpitations,  Sleeplessness  2 nights this week,  Chest pressure.).    No recent viral illness, although she does recall a recent tick bite on May 30 that was NOT  Accompanied by  Erythema migrans,  rash, fevers or headaches      Lab Results  Component Value Date   TSH 0.06 (L) 01/26/2019     ROS: Patient denies headache, fevers, malaise, unintentional weight loss, skin rash, eye pain, sinus congestion and sinus pain, sore throat, dysphagia,  hemoptysis , cough, dyspnea,  wheezing, , orthopnea, edema, abdominal pain, nausea, melena, diarrhea, constipation, flank pain, dysuria, hematuria, urinary  Frequency, nocturia, numbness, tingling, seizures,  Focal weakness, Loss of consciousness,  Tremor, insomnia, depression, anxiety, and suicidal ideation.      Past Medical History:  Diagnosis Date  . GERD (gastroesophageal reflux disease)    infrequent  . hypothyroid     Past Surgical History:  Procedure Laterality Date  . AUGMENTATION MAMMAPLASTY      Family History  Problem Relation Age of Onset  . Diabetes Mother   . Arthritis Mother     SOCIAL HX:  reports that she has never smoked. She has never used smokeless tobacco. She reports current alcohol use of about 5.0 standard drinks of alcohol per week. She reports that she does not use drugs.   Current Outpatient Medications:  .  levothyroxine (SYNTHROID) 112 MCG tablet, Take 1 tablet (112 mcg total) by mouth daily., Disp: 90 tablet, Rfl: 3 .  Multiple Vitamin (MULTIVITAMIN) tablet, Take 1 tablet by mouth daily.  , Disp: , Rfl:  .  metoprolol tartrate (LOPRESSOR) 25 MG tablet, Take 0.5 tablets (12.5 mg total) by mouth 2 (two) times daily., Disp: 30 tablet, Rfl: 0  EXAM:  VITALS per patient if applicable:  GENERAL: alert, oriented, appears well and in no acute distress  HEENT: atraumatic, conjunttiva clear, no obvious abnormalities on inspection of external nose and ears  NECK: normal movements of the head and neck  LUNGS: on inspection no signs of respiratory distress, breathing rate appears normal, no obvious gross SOB, gasping or wheezing  CV: no  obvious cyanosis  MS: moves all visible extremities without noticeable abnormality  PSYCH/NEURO: pleasant and cooperative, no obvious depression or anxiety, speech and thought processing grossly intact  ASSESSMENT AND PLAN:  Hypothyroidism Currently hyperthyroid after 8 + years of underactive thyroid. Suspending reduced dose of levothyroxine  given persistent suppression of TSH .  thryoid antibody tests for Graves/Hashimoto's ordered  Lab Results  Component Value Date   TSH 0.06 (L) 01/26/2019     Hyperthyroidism Suspect thyroiditis vs Graves disease.  Beta blocker give for prn use if palpitations occur    Endocrinology referral in process     I discussed the assessment and treatment plan with the patient. The patient was provided an opportunity to ask questions and all were answered. The patient agreed with the plan and demonstrated an understanding of the instructions.   The patient was advised to call back or seek an in-person evaluation if the symptoms worsen or if the condition fails to improve as anticipated.  I provided 25 minutes of non-face-to-face time during this encounter.   Crecencio Mc, MD

## 2019-01-27 ENCOUNTER — Other Ambulatory Visit: Payer: Self-pay | Admitting: Internal Medicine

## 2019-01-27 DIAGNOSIS — E039 Hypothyroidism, unspecified: Secondary | ICD-10-CM

## 2019-01-27 DIAGNOSIS — E0590 Thyrotoxicosis, unspecified without thyrotoxic crisis or storm: Secondary | ICD-10-CM

## 2019-01-28 DIAGNOSIS — E0590 Thyrotoxicosis, unspecified without thyrotoxic crisis or storm: Secondary | ICD-10-CM | POA: Insufficient documentation

## 2019-01-28 NOTE — Assessment & Plan Note (Signed)
Suspect thyroiditis vs Graves disease.  Beta blocker give for prn use if palpitations occur    Endocrinology referral in process

## 2019-01-28 NOTE — Assessment & Plan Note (Signed)
Currently hyperthyroid after 8 + years of underactive thyroid. Suspending reduced dose of levothyroxine given persistent suppression of TSH .  thryoid antibody tests for Graves/Hashimoto's ordered  Lab Results  Component Value Date   TSH 0.06 (L) 01/26/2019

## 2019-01-29 DIAGNOSIS — Z1211 Encounter for screening for malignant neoplasm of colon: Secondary | ICD-10-CM | POA: Diagnosis not present

## 2019-01-29 LAB — THYROGLOBULIN ANTIBODY: Thyroglobulin Ab: <1 IU/mL (ref <=1)

## 2019-01-29 LAB — THYROGLOBULIN LEVEL: Thyroglobulin: 0.2 ng/mL — ABNORMAL LOW

## 2019-01-29 LAB — THYROID PEROXIDASE ANTIBODY: Thyroperoxidase Ab SerPl-aCnc: 1 IU/mL (ref <9)

## 2019-01-30 ENCOUNTER — Other Ambulatory Visit: Payer: Self-pay | Admitting: Internal Medicine

## 2019-01-30 DIAGNOSIS — E060 Acute thyroiditis: Secondary | ICD-10-CM

## 2019-02-07 ENCOUNTER — Telehealth: Payer: Self-pay | Admitting: Internal Medicine

## 2019-02-07 DIAGNOSIS — R7989 Other specified abnormal findings of blood chemistry: Secondary | ICD-10-CM | POA: Diagnosis not present

## 2019-02-07 DIAGNOSIS — R946 Abnormal results of thyroid function studies: Secondary | ICD-10-CM | POA: Diagnosis not present

## 2019-02-07 LAB — COLOGUARD: Cologuard: NEGATIVE

## 2019-02-07 NOTE — Telephone Encounter (Signed)
MyChart message sent  Re cologaurd negative

## 2019-03-05 LAB — TSH: TSH: 0.73 (ref 0.41–5.90)

## 2019-03-09 ENCOUNTER — Ambulatory Visit: Payer: 59

## 2019-03-13 DIAGNOSIS — R946 Abnormal results of thyroid function studies: Secondary | ICD-10-CM | POA: Diagnosis not present

## 2019-03-13 LAB — TSH: TSH: 0.73 (ref 0.41–5.90)

## 2019-03-14 ENCOUNTER — Ambulatory Visit (INDEPENDENT_AMBULATORY_CARE_PROVIDER_SITE_OTHER): Payer: 59 | Admitting: Internal Medicine

## 2019-03-14 ENCOUNTER — Encounter: Payer: Self-pay | Admitting: Internal Medicine

## 2019-03-14 DIAGNOSIS — E039 Hypothyroidism, unspecified: Secondary | ICD-10-CM | POA: Diagnosis not present

## 2019-03-14 DIAGNOSIS — F419 Anxiety disorder, unspecified: Secondary | ICD-10-CM

## 2019-03-14 DIAGNOSIS — L900 Lichen sclerosus et atrophicus: Secondary | ICD-10-CM

## 2019-03-14 MED ORDER — BUSPIRONE HCL 5 MG PO TABS: 5.0000 mg | ORAL_TABLET | Freq: Three times a day (TID) | ORAL | 1 refills | Status: DC

## 2019-03-14 NOTE — Progress Notes (Signed)
Virtual Visit covnverted to Telephone  Note  This visit type was conducted due to national recommendations for restrictions regarding the COVID-19 pandemic (e.g. social distancing).  This format is felt to be most appropriate for this patient at this time.  All issues noted in this document were discussed and addressed.  No physical exam was performed (except for noted visual exam findings with Video Visits).   I connected with@ on 03/14/19 at  9:30 AM EDT by  telephone and verified that I am speaking with the correct person using two identifiers.Interactive audio and video telecommunications were attempted between this provider and patient, however failed, due to patient having technical difficulties with Internet service.   Location patient: home Location provider: work or home office Persons participating in the virtual visit: patient, provider  I discussed the limitations, risks, security and privacy concerns of performing an evaluation and management service by telephone and the availability of in person appointments. I also discussed with the patient that there may be a patient responsible charge related to this service. The patient expressed understanding and agreed to proceed.    Reason for visit: follow up on hyperthyroid, anxiety , fatigue  HPI:  1) Hyperthyroidism;  Referred to Endocrinology for evaluation following development of hyperthyroidism on baseline dose of levothyroxine  With no evidence of thyroiditis by screeening labs.  Thyroid function now normalized on lower dose of brand name only levothyroxine. 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 Talking very  fast  Today Tearful often . Felt much better after taking a weekend off .  Considered FMLA request but patient has decided to continue working and tey medication for anxiety.  meds discussed today   3) anxiety  Aggravated by recurrence of vaginal itching.  Diagnosed with lichen sclerosis by Dr Garwin Brothers,  Using steroid cream, periodically.   Avoiding epsom salt baths. Has had a recent flare with dysuria,  Swollen vaginal area  Started using cream again  Has appt with Cousins tomorrow.      ROS: See pertinent positives and negatives per HPI.  Past Medical History:  Diagnosis Date  . GERD (gastroesophageal reflux disease)    infrequent  . hypothyroid     Past Surgical History:  Procedure Laterality Date  . AUGMENTATION MAMMAPLASTY      Family History  Problem Relation Age of Onset  . Diabetes Mother   . Arthritis Mother     SOCIAL HX:  reports that she has never smoked. She has never used smokeless tobacco. She reports current alcohol use of about 5.0 standard drinks of alcohol per week. She reports that she does not use drugs.   Current Outpatient Medications:  .  metoprolol tartrate (LOPRESSOR) 25 MG tablet, Take 0.5 tablets (12.5 mg total) by mouth 2 (two) times daily., Disp: 30 tablet, Rfl: 0 .  Multiple Vitamin (MULTIVITAMIN) tablet, Take 1 tablet by mouth daily.  , Disp: , Rfl:  .  SYNTHROID 100 MCG tablet, Take 100 mcg by mouth daily before breakfast. , Disp: , Rfl:  .  busPIRone (BUSPAR) 5 MG tablet, Take 1 tablet (5 mg total) by mouth 3 (three) times daily., Disp: 90 tablet, Rfl: 1  EXAM:   General impression: alert, cooperative, anxious  and articulate.  No signs of being in distress  Lungs: speech is fluent sentence length suggests that patient is not short of breath  and not punctuated by cough, sneezing or sniffing. Marland Kitchen   Psych: affect anxious .  speech is articulate and non pressured .  Denies suicidal thoughts   ASSESSMENT AND PLAN:  Discussed the following assessment and plan:  Hypothyroidism, unspecified type  Lichen sclerosus et atrophicus  Anxiety  Hypothyroidism Recent period of hyperthyroidism now resolved by repeat labs done by Dr Gabriel Carina.  TSH was 8 after  suspension of meds.     Last TSH 0.728 on 100 mcg NBO Synthroid  Lichen sclerosus et atrophicus Diagnosed by dr Garwin Brothers,  Now  Managed with steroid cream   Anxiety Aggravated by non life threatening health conditions and highly stressful position in ER.  Recommending trial of buspirone.     I discussed the assessment and treatment plan with the patient. The patient was provided an opportunity to ask questions and all were answered. The patient agreed with the plan and demonstrated an understanding of the instructions.   The patient was advised to call back or seek an in-person evaluation if the symptoms worsen or if the condition fails to improve as anticipated.   I provided  25 minutes of non-face-to-face time during this encounter reviewing patient's current problems and post surgeries.  Providing counseling on the above mentioned problems , and coordination  of care .  Crecencio Mc, MD

## 2019-03-15 DIAGNOSIS — L280 Lichen simplex chronicus: Secondary | ICD-10-CM | POA: Diagnosis not present

## 2019-03-16 ENCOUNTER — Other Ambulatory Visit (INDEPENDENT_AMBULATORY_CARE_PROVIDER_SITE_OTHER): Payer: 59

## 2019-03-16 ENCOUNTER — Other Ambulatory Visit: Payer: Self-pay

## 2019-03-16 ENCOUNTER — Ambulatory Visit: Payer: 59

## 2019-03-16 DIAGNOSIS — E060 Acute thyroiditis: Secondary | ICD-10-CM

## 2019-03-16 DIAGNOSIS — Z23 Encounter for immunization: Secondary | ICD-10-CM

## 2019-03-16 DIAGNOSIS — E034 Atrophy of thyroid (acquired): Secondary | ICD-10-CM

## 2019-03-16 LAB — TSH: TSH: 0.54 u[IU]/mL (ref 0.35–4.50)

## 2019-03-16 LAB — SARS-COV-2 IGG: SARS-COV-2 IgG: 0.03

## 2019-03-16 NOTE — Assessment & Plan Note (Signed)
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  Recommending trial of buspirone.

## 2019-03-16 NOTE — Assessment & Plan Note (Signed)
Diagnosed by dr Garwin Brothers,  Now  Managed with steroid cream

## 2019-03-16 NOTE — Assessment & Plan Note (Addendum)
Recent period of hyperthyroidism now resolved by repeat labs done by Dr Gabriel Carina.  TSH was 8 after suspension of meds.     Last TSH 0.728 on 100 mcg NBO Synthroid

## 2019-03-20 DIAGNOSIS — E039 Hypothyroidism, unspecified: Secondary | ICD-10-CM | POA: Diagnosis not present

## 2019-04-11 ENCOUNTER — Other Ambulatory Visit: Payer: Self-pay | Admitting: Internal Medicine

## 2019-04-11 ENCOUNTER — Ambulatory Visit
Admission: RE | Admit: 2019-04-11 | Discharge: 2019-04-11 | Disposition: A | Discharge status: Home / Self Care | Payer: 59 | Source: Ambulatory Visit | Attending: Internal Medicine | Admitting: Internal Medicine

## 2019-04-11 DIAGNOSIS — Z1231 Encounter for screening mammogram for malignant neoplasm of breast: Secondary | ICD-10-CM | POA: Diagnosis not present

## 2019-04-20 ENCOUNTER — Other Ambulatory Visit: Payer: Self-pay

## 2019-04-23 ENCOUNTER — Other Ambulatory Visit: Payer: Self-pay

## 2019-04-23 ENCOUNTER — Ambulatory Visit: Payer: 59 | Admitting: Internal Medicine

## 2019-04-23 ENCOUNTER — Encounter: Payer: Self-pay | Admitting: Internal Medicine

## 2019-04-23 VITALS — BP 100/78 | HR 84 | Temp 96.4°F | Resp 14 | Ht 59.5 in | Wt 118.8 lb

## 2019-04-23 DIAGNOSIS — E0590 Thyrotoxicosis, unspecified without thyrotoxic crisis or storm: Secondary | ICD-10-CM | POA: Diagnosis not present

## 2019-04-23 DIAGNOSIS — E039 Hypothyroidism, unspecified: Secondary | ICD-10-CM | POA: Diagnosis not present

## 2019-04-23 DIAGNOSIS — L280 Lichen simplex chronicus: Secondary | ICD-10-CM | POA: Diagnosis not present

## 2019-04-23 DIAGNOSIS — F419 Anxiety disorder, unspecified: Secondary | ICD-10-CM | POA: Diagnosis not present

## 2019-04-23 MED ORDER — LEVOTHYROXINE SODIUM 88 MCG PO TABS: 88.0000 ug | ORAL_TABLET | Freq: Every day | ORAL | 0 refills | Status: DC

## 2019-04-23 MED ORDER — CLONAZEPAM 0.5 MG PO TABS: 0.2500 mg | ORAL_TABLET | Freq: Two times a day (BID) | ORAL | 0 refills | Status: DC | PRN

## 2019-04-23 NOTE — Assessment & Plan Note (Signed)
Resolved.  Has resume synthroid , dose changed to 88 mcg daily

## 2019-04-23 NOTE — Assessment & Plan Note (Addendum)
RElated TO JOB STRESSORS. Which occur on ly on weekends.  Did not tolerate buspirone.    Refilling clonazepam for prn use. The risks and benefits of benzodiazepine use were discussed with patient today including excessive sedation leading to respiratory depression,  impaired thinking/driving, and addiction.  Patient was advised to avoid concurrent use with alcohol, to use medication only as needed and not to share with others  .

## 2019-04-23 NOTE — Progress Notes (Signed)
Subjective:  Patient ID: Joy Horton, female    DOB: 03-12-1961  Age: 58 y.o. MRN: [id number]  CC: The primary encounter diagnosis was Hypothyroidism, unspecified type. Diagnoses of Anxiety and Hyperthyroidism were also pertinent to this visit.  HPI Joy Horton presents for follow up on multiple issues   Hypothyroidism with recent episode of thyroiditis   Saw Endocrinology on Oct 13  TSH has normalized on 100 mcg NBO  6 days per week instead of 7. When TSH was 0. 54 (was higher the week prior at [facility] office)  Causing some constipation   Anxiety;  Only experienced when she thinks about going to work . 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   Did not tolerate 5 mg buspirone dose: first dose caused chills, hot flashes,  Heart beating hard .  Symptoms wore off 24 hours after last dose  (3 days of therapy).   Decided to use clonazepam leftover from years ago,  Using 1/2 tablet Only on the days she works   Presenter, broadcasting for lichen sclerosis  Outpatient Medications Prior to Visit  Medication Sig Dispense Refill  . Multiple Vitamin (MULTIVITAMIN) tablet Take 1 tablet by mouth daily.      Marland Kitchen SYNTHROID 100 MCG tablet Take 100 mcg by mouth daily before breakfast.     . metoprolol tartrate (LOPRESSOR) 25 MG tablet Take 0.5 tablets (12.5 mg total) by mouth 2 (two) times daily. (Patient not taking: Reported on 04/23/2019) 30 tablet 0  . busPIRone (BUSPAR) 5 MG tablet Take 1 tablet (5 mg total) by mouth 3 (three) times daily. (Patient not taking: Reported on 04/23/2019) 90 tablet 1   No facility-administered medications prior to visit.     Review of Systems;  Patient denies headache, fevers, malaise, unintentional weight loss, skin rash, eye pain, sinus congestion and sinus pain, sore throat, dysphagia,  hemoptysis , cough, dyspnea, wheezing, chest pain, palpitations, orthopnea, edema, abdominal pain, nausea,  melena, diarrhea, constipation, flank pain, dysuria, hematuria, urinary  Frequency, nocturia, numbness, tingling, seizures,  Focal weakness, Loss of consciousness,  Tremor, insomnia, depression, anxiety, and suicidal ideation.      Objective:  BP 100/78 (BP Location: Left Arm, Patient Position: Sitting, Cuff Size: Normal)   Pulse 84   Temp (!) 96.4 F (35.8 C) (Temporal)   Resp 14   Ht 4' 11.5" (1.511 m)   Wt 118 lb 12.8 oz (53.9 kg)   SpO2 98%   BMI 23.59 kg/m   BP Readings from Last 3 Encounters:  04/23/19 100/78  09/02/17 116/78  08/24/17 118/84    Wt Readings from Last 3 Encounters:  04/23/19 118 lb 12.8 oz (53.9 kg)  03/14/19 123 lb (55.8 kg)  01/26/19 123 lb (55.8 kg)    General appearance: alert, cooperative and appears stated age Ears: normal TM's and external ear canals both ears Throat: lips, mucosa, and tongue normal; teeth and gums normal Neck: no adenopathy, no carotid bruit, supple, symmetrical, trachea midline and thyroid not enlarged, symmetric, no tenderness/mass/nodules Back: symmetric, no curvature. ROM normal. No CVA tenderness. Lungs: clear to auscultation bilaterally Heart: regular rate and rhythm, S1, S2 normal, no murmur, click, rub or gallop Abdomen: soft, non-tender; bowel sounds normal; no masses,  no organomegaly Pulses: 2+ and symmetric Skin: Skin color, texture, turgor normal. No rashes or lesions Lymph nodes: Cervical, supraclavicular, and axillary nodes normal.  No results found for: HGBA1C  Lab Results  Component Value Date   CREATININE 0.58 12/29/2018   CREATININE 0.67 08/05/2017   CREATININE 0.72 01/10/2017    Lab Results  Component Value Date   WBC 7.5 06/20/2015   HGB 13.1 06/20/2015   HCT 39.5 06/20/2015   PLT 369.0 06/20/2015   GLUCOSE 79 12/29/2018   CHOL 224 (H) 12/29/2018   TRIG 171.0 (H) 12/29/2018   HDL 51.00 12/29/2018   LDLCALC 139 (H) 12/29/2018   ALT 15 12/29/2018   AST 20 12/29/2018   NA 140 12/29/2018   K  3.9 12/29/2018   CL 102 12/29/2018   CREATININE 0.58 12/29/2018   BUN 19 12/29/2018   CO2 30 12/29/2018   TSH 0.54 03/16/2019    Mm 3d Screen Breast W/implant Bilateral  Result Date: 04/12/2019 CLINICAL DATA:  Screening. EXAM: DIGITAL SCREENING BILATERAL MAMMOGRAM WITH IMPLANTS, CAD AND TOMO The patient has retropectoral implants. Standard and implant displaced views were performed. COMPARISON:  Previous exam(s). ACR Breast Density Category d: The breast tissue is extremely dense, which lowers the sensitivity of mammography. FINDINGS: There are no findings suspicious for malignancy. Images were processed with CAD. IMPRESSION: No mammographic evidence of malignancy. A result letter of this screening mammogram will be mailed directly to the patient. RECOMMENDATION: Screening mammogram in one year. (Code:SM-B-01Y) BI-RADS CATEGORY  1:  Negative. Electronically Signed   By: Ammie Ferrier M.D.   On: 04/12/2019 13:48    Assessment & Plan:   Problem List Items Addressed This Visit      Unprioritized   Hypothyroidism - Primary    Currently taking 100 mcg 6 days per week,  Due for refill.  Lower dose to 88 mcg DAILY       Relevant Medications   levothyroxine (SYNTHROID) 88 MCG tablet   Other Relevant Orders   TSH   Anxiety    RElated TO JOB STRESSORS. Which occur on ly on weekends.  Did not tolerate buspirone.    Refilling clonazepam for prn use. The risks and benefits of benzodiazepine use were discussed with patient today including excessive sedation leading to respiratory depression,  impaired thinking/driving, and addiction.  Patient was advised to avoid concurrent use with alcohol, to use medication only as needed and not to share with others  .       Hyperthyroidism    Resolved.  Has resume synthroid , dose changed to 88 mcg daily       Relevant Medications   levothyroxine (SYNTHROID) 88 MCG tablet      I have discontinued Joy Horton's Synthroid and busPIRone. I am also  having her start on levothyroxine. Additionally, I am having her maintain her multivitamin, metoprolol tartrate, and clonazePAM.  Meds ordered this encounter  Medications  . clonazePAM (KLONOPIN) 0.5 MG tablet    Sig: Take 0.5 tablets (0.25 mg total) by mouth 2 (two) times daily as needed for anxiety.    Dispense:  30 tablet    Refill:  0  . levothyroxine (SYNTHROID) 88 MCG tablet    Sig: Take 1 tablet (88 mcg total) by mouth daily before breakfast. NAME BRAND ONLY    Dispense:  90 tablet    Refill:  0    Medications Discontinued During This Encounter  Medication Reason  . busPIRone (BUSPAR) 5 MG tablet   . SYNTHROID 100 MCG tablet     Follow-up: No follow-ups on file.   Crecencio Mc, MD

## 2019-04-23 NOTE — Assessment & Plan Note (Signed)
Currently taking 100 mcg 6 days per week,  Due for refill.  Lower dose to 88 mcg DAILY

## 2019-04-23 NOTE — Patient Instructions (Addendum)
For the constipation  60 ounces of water minimum  Colace 100 mg at bedtime prn or metamucil  25 grams of  fiber goal ([place] Whole Wheat tortillas have 25 g and they taste great!)   Reducing synthroid Dose to 88 mcg DAILY

## 2019-05-02 DIAGNOSIS — F4322 Adjustment disorder with anxiety: Secondary | ICD-10-CM | POA: Diagnosis not present

## 2019-05-17 DIAGNOSIS — F4322 Adjustment disorder with anxiety: Secondary | ICD-10-CM | POA: Diagnosis not present

## 2019-06-29 ENCOUNTER — Telehealth: Payer: Self-pay | Admitting: Lab

## 2019-06-29 NOTE — Telephone Encounter (Signed)
Called Pt No answer left VM to call office to schedule a lab appt.

## 2019-07-02 ENCOUNTER — Other Ambulatory Visit (INDEPENDENT_AMBULATORY_CARE_PROVIDER_SITE_OTHER): Payer: 59

## 2019-07-02 ENCOUNTER — Other Ambulatory Visit: Payer: Self-pay

## 2019-07-02 DIAGNOSIS — E039 Hypothyroidism, unspecified: Secondary | ICD-10-CM

## 2019-07-03 LAB — TSH: TSH: 7.25 u[IU]/mL — ABNORMAL HIGH (ref 0.35–4.50)

## 2019-07-04 NOTE — Telephone Encounter (Signed)
Patient needs lab to recheck TSH 6 weeks after a medication change.

## 2019-07-05 ENCOUNTER — Telehealth: Payer: Self-pay | Admitting: Internal Medicine

## 2019-07-05 DIAGNOSIS — E039 Hypothyroidism, unspecified: Secondary | ICD-10-CM

## 2019-07-05 MED ORDER — LEVOTHYROXINE SODIUM 100 MCG PO TABS: 100.0000 ug | ORAL_TABLET | Freq: Every day | ORAL | 1 refills | Status: DC

## 2019-07-05 NOTE — Telephone Encounter (Signed)
There is a lab result note on this pt about her tsh.  She sent a my chart message stating she was on 39mcg of synthroid.  I informed her to increase to 171mcg q day of synthroid.  rx sent in. TSH ordered.  She needs a non fasting lab appt in 6 weeks - to recheck tsh.

## 2019-07-05 NOTE — Telephone Encounter (Signed)
Noted and done

## 2019-07-17 DIAGNOSIS — Z1151 Encounter for screening for human papillomavirus (HPV): Secondary | ICD-10-CM | POA: Diagnosis not present

## 2019-07-17 DIAGNOSIS — Z01419 Encounter for gynecological examination (general) (routine) without abnormal findings: Secondary | ICD-10-CM | POA: Diagnosis not present

## 2019-07-17 DIAGNOSIS — Z6823 Body mass index (BMI) 23.0-23.9, adult: Secondary | ICD-10-CM | POA: Diagnosis not present

## 2019-08-13 ENCOUNTER — Other Ambulatory Visit (INDEPENDENT_AMBULATORY_CARE_PROVIDER_SITE_OTHER): Payer: 59

## 2019-08-13 ENCOUNTER — Other Ambulatory Visit: Payer: Self-pay

## 2019-08-13 DIAGNOSIS — E039 Hypothyroidism, unspecified: Secondary | ICD-10-CM | POA: Diagnosis not present

## 2019-08-13 LAB — TSH: TSH: 0.98 u[IU]/mL (ref 0.35–4.50)

## 2019-12-04 ENCOUNTER — Telehealth: Payer: Self-pay

## 2019-12-04 DIAGNOSIS — E039 Hypothyroidism, unspecified: Secondary | ICD-10-CM

## 2019-12-04 NOTE — Telephone Encounter (Signed)
error 

## 2019-12-06 NOTE — Addendum Note (Signed)
Addended by: Crecencio Mc on: 12/06/2019 01:04 PM   Modules accepted: Orders

## 2019-12-06 NOTE — Telephone Encounter (Signed)
Spoke with pt and scheduled a lab appt.

## 2019-12-06 NOTE — Telephone Encounter (Signed)
Pt would like to know if she can have TSH rechecked because she has been more tired lately.

## 2019-12-06 NOTE — Telephone Encounter (Signed)
tsh ordered.

## 2019-12-07 ENCOUNTER — Other Ambulatory Visit (INDEPENDENT_AMBULATORY_CARE_PROVIDER_SITE_OTHER): Payer: 59

## 2019-12-07 ENCOUNTER — Other Ambulatory Visit: Payer: Self-pay

## 2019-12-07 ENCOUNTER — Other Ambulatory Visit: Payer: Self-pay | Admitting: Internal Medicine

## 2019-12-07 DIAGNOSIS — E039 Hypothyroidism, unspecified: Secondary | ICD-10-CM | POA: Diagnosis not present

## 2019-12-07 LAB — TSH: TSH: 0.16 u[IU]/mL — ABNORMAL LOW (ref 0.35–4.50)

## 2019-12-07 MED ORDER — LEVOTHYROXINE SODIUM 88 MCG PO TABS: 88.0000 ug | ORAL_TABLET | Freq: Every day | ORAL | 3 refills | Status: DC

## 2019-12-07 NOTE — Assessment & Plan Note (Signed)
.  Thyroid function is overactive on current dose  Of 100 mcg daily  I have sent   a lower dose of levothyroxine to patient's  pharmacy and would like patient to change immediately. We will need to recheck her thyroid function after a minimum of 6 weeks post medication change,

## 2019-12-07 NOTE — Addendum Note (Signed)
Addended by: Crecencio Mc on: 12/07/2019 04:59 PM   Modules accepted: Orders

## 2020-01-10 DIAGNOSIS — L2389 Allergic contact dermatitis due to other agents: Secondary | ICD-10-CM | POA: Diagnosis not present

## 2020-01-18 ENCOUNTER — Other Ambulatory Visit (INDEPENDENT_AMBULATORY_CARE_PROVIDER_SITE_OTHER): Payer: 59

## 2020-01-18 ENCOUNTER — Telehealth: Payer: Self-pay | Admitting: Internal Medicine

## 2020-01-18 ENCOUNTER — Other Ambulatory Visit: Payer: Self-pay

## 2020-01-18 DIAGNOSIS — E039 Hypothyroidism, unspecified: Secondary | ICD-10-CM

## 2020-01-18 NOTE — Telephone Encounter (Signed)
Pt states that they need a TSH lab.

## 2020-01-18 NOTE — Telephone Encounter (Signed)
Spoke with pt and she has been scheduled for the repeat TSH for this afternoon.

## 2020-01-18 NOTE — Addendum Note (Signed)
Addended by: Leeanne Rio on: 01/18/2020 02:37 PM   Modules accepted: Orders

## 2020-01-19 LAB — TSH: TSH: 1.15 u[IU]/mL (ref 0.450–4.500)

## 2020-03-05 DIAGNOSIS — E039 Hypothyroidism, unspecified: Secondary | ICD-10-CM

## 2020-03-12 ENCOUNTER — Other Ambulatory Visit: Payer: Self-pay

## 2020-03-12 ENCOUNTER — Other Ambulatory Visit (INDEPENDENT_AMBULATORY_CARE_PROVIDER_SITE_OTHER): Payer: 59

## 2020-03-12 DIAGNOSIS — E039 Hypothyroidism, unspecified: Secondary | ICD-10-CM

## 2020-03-14 LAB — THYROID PANEL WITH TSH
Free Thyroxine Index: 3 (ref 1.4–3.8)
T3 Uptake: 35 % (ref 22–35)
T4, Total: 8.5 ug/dL (ref 5.1–11.9)
TSH: 1.23 mIU/L (ref 0.40–4.50)

## 2020-03-16 NOTE — Progress Notes (Signed)
Your thyroid function is normal  on your current levothyroxine mcg daily  dose.  Please continue your current dose and we will repeat in 6 months . 

## 2020-03-19 ENCOUNTER — Other Ambulatory Visit: Payer: Self-pay

## 2020-03-19 ENCOUNTER — Encounter: Payer: Self-pay | Admitting: Internal Medicine

## 2020-03-19 ENCOUNTER — Telehealth (INDEPENDENT_AMBULATORY_CARE_PROVIDER_SITE_OTHER): Payer: 59 | Admitting: Internal Medicine

## 2020-03-19 VITALS — Ht 59.0 in | Wt 105.0 lb

## 2020-03-19 DIAGNOSIS — E782 Mixed hyperlipidemia: Secondary | ICD-10-CM

## 2020-03-19 DIAGNOSIS — R5383 Other fatigue: Secondary | ICD-10-CM | POA: Diagnosis not present

## 2020-03-19 DIAGNOSIS — E559 Vitamin D deficiency, unspecified: Secondary | ICD-10-CM | POA: Diagnosis not present

## 2020-03-19 NOTE — Progress Notes (Signed)
Virtual Visit via [place]  This visit type was conducted due to national recommendations for restrictions regarding the COVID-19 pandemic (e.g. social distancing).  This format is felt to be most appropriate for this patient at this time.  All issues noted in this document were discussed and addressed.  No physical exam was performed (except for noted visual exam findings with Video Visits).   I connected with@ on 03/19/20 at  4:30 PM EDT by a video enabled telemedicine application  and verified that I am speaking with the correct person using two identifiers. Location patient: home Location provider: work or home office Persons participating in the virtual visit: patient, provider  I discussed the limitations, risks, security and privacy concerns of performing an evaluation and management service by telephone and the availability of in person appointments. I also discussed with the patient that there may be a patient responsible charge related to this service. The patient expressed understanding and agreed to proceed.    Reason for visit: follow up on labs   HPI:  59 yr old female with hypothyroidism,  Presents for follow up on recent thyroid function b12 screen which was normal . Patient requested early testing due to persistent fatigue not accompanied by chest pain or shortness of breath.  She has interrupted sleep due to her husband's nocturnal habits.  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  She has lost weight voluntarily and is exercising periodically but no regularly    ROS: Patient denies headache, fevers, malaise, unintentional weight loss, skin rash, eye pain, sinus congestion and sinus pain, sore throat, dysphagia,  hemoptysis , cough, dyspnea, wheezing, chest pain, palpitations, orthopnea, edema,  abdominal pain, nausea, melena, diarrhea, constipation, flank pain, dysuria, hematuria, urinary  Frequency, nocturia, numbness, tingling, seizures,  Focal weakness, Loss of consciousness,  Tremor, insomnia, depression, anxiety, and suicidal ideation.     Past Medical History:  Diagnosis Date   GERD (gastroesophageal reflux disease)    infrequent   hypothyroid     Past Surgical History:  Procedure Laterality Date   AUGMENTATION MAMMAPLASTY Bilateral 1995    Family History  Problem Relation Age of Onset   Diabetes Mother    Arthritis Mother     SOCIAL HX:  reports that she has never smoked. She has never used smokeless tobacco. She reports current alcohol use of about 5.0 standard drinks of alcohol per week. She reports that she does not use drugs.   Current Outpatient Medications:    levothyroxine (SYNTHROID) 88 MCG tablet, Take 1 tablet (88 mcg total) by mouth daily., Disp: 90 tablet, Rfl: 3   Multiple Vitamin (MULTIVITAMIN) tablet, Take 1 tablet by mouth daily.  , Disp: , Rfl:   EXAM:  VITALS per patient if applicable:  GENERAL: alert, oriented, appears well and in no acute distress  HEENT: atraumatic, conjunttiva clear, no obvious abnormalities on inspection of external nose and ears  NECK: normal movements of the head and neck  LUNGS: on inspection no signs of respiratory distress, breathing rate appears normal, no obvious gross SOB, gasping or wheezing  CV: no obvious cyanosis  MS: moves all visible extremities without noticeable abnormality  PSYCH/NEURO: pleasant and cooperative, no obvious depression or anxiety, speech and thought processing grossly intact  ASSESSMENT AND PLAN:  Discussed the following assessment and plan:  Fatigue,  unspecified type - Plan: Vitamin B12, Iron, TIBC and Ferritin Panel, Comprehensive metabolic panel, CBC with Differential/Platelet  Vitamin D deficiency - Plan: VITAMIN D 25 Hydroxy (Vit-D Deficiency, Fractures)  Moderate  mixed hyperlipidemia not requiring statin therapy - Plan: Lipid panel  Fatigue Likely due to stress and poor sleep,,  But will have her return for additional screening . Thyroid function is normal.   Lab Results  Component Value Date   TSH 1.23 03/12/2020       I discussed the assessment and treatment plan with the patient. The patient was provided an opportunity to ask questions and all were answered. The patient agreed with the plan and demonstrated an understanding of the instructions.   The patient was advised to call back or seek an in-person evaluation if the symptoms worsen or if the condition fails to improve as anticipated.  I provided  30 minutes of face-to-face time during this encounter.   Crecencio Mc, MD

## 2020-03-21 DIAGNOSIS — R5383 Other fatigue: Secondary | ICD-10-CM | POA: Insufficient documentation

## 2020-03-21 NOTE — Assessment & Plan Note (Signed)
Likely due to stress and poor sleep,,  But will have her return for additional screening . Thyroid function is normal.   Lab Results  Component Value Date   TSH 1.23 03/12/2020

## 2020-04-15 ENCOUNTER — Other Ambulatory Visit (INDEPENDENT_AMBULATORY_CARE_PROVIDER_SITE_OTHER): Payer: 59

## 2020-04-15 ENCOUNTER — Other Ambulatory Visit: Payer: Self-pay

## 2020-04-15 DIAGNOSIS — E782 Mixed hyperlipidemia: Secondary | ICD-10-CM | POA: Diagnosis not present

## 2020-04-15 DIAGNOSIS — R5383 Other fatigue: Secondary | ICD-10-CM

## 2020-04-15 DIAGNOSIS — E559 Vitamin D deficiency, unspecified: Secondary | ICD-10-CM

## 2020-04-15 LAB — CBC WITH DIFFERENTIAL/PLATELET
Basophils Absolute: 0.1 10*3/uL (ref 0.0–0.1)
Basophils Relative: 1.3 % (ref 0.0–3.0)
Eosinophils Absolute: 0.2 10*3/uL (ref 0.0–0.7)
Eosinophils Relative: 4.3 % (ref 0.0–5.0)
HCT: 39.7 % (ref 36.0–46.0)
Hemoglobin: 13.6 g/dL (ref 12.0–15.0)
Lymphocytes Relative: 27.8 % (ref 12.0–46.0)
Lymphs Abs: 1.5 10*3/uL (ref 0.7–4.0)
MCHC: 34.2 g/dL (ref 30.0–36.0)
MCV: 91.8 fl (ref 78.0–100.0)
Monocytes Absolute: 0.4 10*3/uL (ref 0.1–1.0)
Monocytes Relative: 7.4 % (ref 3.0–12.0)
Neutro Abs: 3.2 10*3/uL (ref 1.4–7.7)
Neutrophils Relative %: 59.2 % (ref 43.0–77.0)
Platelets: 320 10*3/uL (ref 150.0–400.0)
RBC: 4.32 Mil/uL (ref 3.87–5.11)
RDW: 12.8 % (ref 11.5–15.5)
WBC: 5.4 10*3/uL (ref 4.0–10.5)

## 2020-04-15 LAB — LIPID PANEL
Cholesterol: 212 mg/dL — ABNORMAL HIGH (ref 0–200)
HDL: 58.8 mg/dL
LDL Cholesterol: 137 mg/dL — ABNORMAL HIGH (ref 0–99)
NonHDL: 153.15
Total CHOL/HDL Ratio: 4
Triglycerides: 83 mg/dL (ref 0.0–149.0)
VLDL: 16.6 mg/dL (ref 0.0–40.0)

## 2020-04-15 LAB — COMPREHENSIVE METABOLIC PANEL WITH GFR
ALT: 13 U/L (ref 0–35)
AST: 22 U/L (ref 0–37)
Albumin: 4.4 g/dL (ref 3.5–5.2)
Alkaline Phosphatase: 56 U/L (ref 39–117)
BUN: 16 mg/dL (ref 6–23)
CO2: 31 meq/L (ref 19–32)
Calcium: 9.3 mg/dL (ref 8.4–10.5)
Chloride: 103 meq/L (ref 96–112)
Creatinine, Ser: 0.63 mg/dL (ref 0.40–1.20)
GFR: 95.71 mL/min
Glucose, Bld: 76 mg/dL (ref 70–99)
Potassium: 4.1 meq/L (ref 3.5–5.1)
Sodium: 140 meq/L (ref 135–145)
Total Bilirubin: 0.8 mg/dL (ref 0.2–1.2)
Total Protein: 6.4 g/dL (ref 6.0–8.3)

## 2020-04-15 LAB — VITAMIN B12: Vitamin B-12: 514 pg/mL (ref 211–911)

## 2020-04-15 LAB — VITAMIN D 25 HYDROXY (VIT D DEFICIENCY, FRACTURES): VITD: 30.15 ng/mL (ref 30.00–100.00)

## 2020-04-16 LAB — IRON,TIBC AND FERRITIN PANEL
%SAT: 37 % (calc) (ref 16–45)
Ferritin: 91 ng/mL (ref 16–288)
Iron: 117 ug/dL (ref 45–160)
TIBC: 318 mcg/dL (calc) (ref 250–450)

## 2020-04-16 NOTE — Progress Notes (Signed)
Your Labs including thyroid are all normal.  Nothing to suggest a cause for your profound fatigue.

## 2020-05-16 ENCOUNTER — Other Ambulatory Visit: Payer: Self-pay | Admitting: Internal Medicine

## 2020-05-16 DIAGNOSIS — Z1231 Encounter for screening mammogram for malignant neoplasm of breast: Secondary | ICD-10-CM

## 2020-05-19 ENCOUNTER — Ambulatory Visit
Admission: RE | Admit: 2020-05-19 | Discharge: 2020-05-19 | Disposition: A | Discharge status: Home / Self Care | Payer: 59 | Source: Ambulatory Visit | Attending: Internal Medicine | Admitting: Internal Medicine

## 2020-05-19 ENCOUNTER — Other Ambulatory Visit: Payer: Self-pay

## 2020-05-19 DIAGNOSIS — Z1231 Encounter for screening mammogram for malignant neoplasm of breast: Secondary | ICD-10-CM | POA: Insufficient documentation

## 2020-08-07 ENCOUNTER — Other Ambulatory Visit: Payer: Self-pay | Admitting: Internal Medicine

## 2020-08-07 DIAGNOSIS — E039 Hypothyroidism, unspecified: Secondary | ICD-10-CM

## 2020-08-08 ENCOUNTER — Other Ambulatory Visit (INDEPENDENT_AMBULATORY_CARE_PROVIDER_SITE_OTHER): Payer: 59

## 2020-08-08 ENCOUNTER — Other Ambulatory Visit: Payer: Self-pay

## 2020-08-08 DIAGNOSIS — E039 Hypothyroidism, unspecified: Secondary | ICD-10-CM

## 2020-08-09 LAB — THYROID PANEL WITH TSH
Free Thyroxine Index: 2.6 (ref 1.4–3.8)
T3 Uptake: 32 % (ref 22–35)
T4, Total: 8 ug/dL (ref 5.1–11.9)
TSH: 2.46 mIU/L (ref 0.40–4.50)

## 2020-08-12 ENCOUNTER — Other Ambulatory Visit: Payer: Self-pay

## 2020-08-13 ENCOUNTER — Other Ambulatory Visit: Payer: Self-pay | Admitting: Internal Medicine

## 2020-08-13 ENCOUNTER — Other Ambulatory Visit: Payer: Self-pay

## 2020-08-13 ENCOUNTER — Encounter: Payer: Self-pay | Admitting: Internal Medicine

## 2020-08-13 ENCOUNTER — Ambulatory Visit: Payer: 59 | Admitting: Internal Medicine

## 2020-08-13 VITALS — BP 102/80 | HR 82 | Temp 98.0°F | Resp 14 | Ht 59.0 in | Wt 117.8 lb

## 2020-08-13 DIAGNOSIS — R590 Localized enlarged lymph nodes: Secondary | ICD-10-CM

## 2020-08-13 DIAGNOSIS — R5383 Other fatigue: Secondary | ICD-10-CM | POA: Diagnosis not present

## 2020-08-13 DIAGNOSIS — J029 Acute pharyngitis, unspecified: Secondary | ICD-10-CM

## 2020-08-13 DIAGNOSIS — Z23 Encounter for immunization: Secondary | ICD-10-CM

## 2020-08-13 DIAGNOSIS — S31823A Puncture wound without foreign body of left buttock, initial encounter: Secondary | ICD-10-CM | POA: Diagnosis not present

## 2020-08-13 LAB — CBC WITH DIFFERENTIAL/PLATELET
Basophils Absolute: 0.1 10*3/uL (ref 0.0–0.1)
Basophils Relative: 1.1 % (ref 0.0–3.0)
Eosinophils Absolute: 0.1 10*3/uL (ref 0.0–0.7)
Eosinophils Relative: 1.8 % (ref 0.0–5.0)
HCT: 37.8 % (ref 36.0–46.0)
Hemoglobin: 12.8 g/dL (ref 12.0–15.0)
Lymphocytes Relative: 33.7 % (ref 12.0–46.0)
Lymphs Abs: 2.2 10*3/uL (ref 0.7–4.0)
MCHC: 33.9 g/dL (ref 30.0–36.0)
MCV: 92.2 fl (ref 78.0–100.0)
Monocytes Absolute: 0.4 10*3/uL (ref 0.1–1.0)
Monocytes Relative: 6.4 % (ref 3.0–12.0)
Neutro Abs: 3.7 10*3/uL (ref 1.4–7.7)
Neutrophils Relative %: 57 % (ref 43.0–77.0)
Platelets: 340 10*3/uL (ref 150.0–400.0)
RBC: 4.1 Mil/uL (ref 3.87–5.11)
RDW: 13 % (ref 11.5–15.5)
WBC: 6.5 10*3/uL (ref 4.0–10.5)

## 2020-08-13 LAB — MONONUCLEOSIS SCREEN: Mono Screen: NEGATIVE

## 2020-08-13 MED ORDER — AMOXICILLIN-POT CLAVULANATE 875-125 MG PO TABS: 1.0000 | ORAL_TABLET | Freq: Two times a day (BID) | ORAL | 0 refills | Status: DC

## 2020-08-13 NOTE — Progress Notes (Signed)
Subjective:  Patient ID: Joy Horton, female    DOB: September 11, 1960  Age: 60 y.o. MRN: [id number]  CC: The primary encounter diagnosis was Cervical lymphadenopathy. Diagnoses of Pharyngitis, unspecified etiology, Need for Td vaccine, Puncture wound of left buttock, initial encounter, and Fatigue, unspecified type were also pertinent to this visit.  HPI Joy Horton presents for  Acute issues.  .This visit occurred during the SARS-CoV-2 public health emergency.  Safety protocols were in place, including screening questions prior to the visit, additional usage of staff PPE, and extensive cleaning of exam room while observing appropriate contact time as indicated for disinfecting solutions.   1) evaluation of puncture wound to left buttock .  occurred 2 days  ago while rescuing her horse who had become entangled in a barbed wire fence.  Her left lower buttock became impaled on a barbed wire.  The wire penetrated about 2 inches per patient.  She cleaned the wound with running water ,  Soap and H202.  There has been no erythema, fluctuance, fevers or signs of cellulitis.  Last Td vaccine 2015.    2) Persistent fatigued;  She feels exhausted all the time. Not sleepy.    Thyroid labs normal.  Bowels moving more slowly.  Weight gain of  9 lbs since medication change and since stopped [employer],   but  Notes dietary changes due to father staying with her for 5 weeks , eating more.   Off of weight watchers program for several weeks,.  Has had  PND .  Last covid test was negative  Sunday , third one  In the past few weeks,  all negative .   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  Double masked during all activities.  Not exercising regularly yet.  .      Outpatient Medications Prior to Visit  Medication Sig Dispense Refill  . levothyroxine (SYNTHROID) 88 MCG tablet Take 1 tablet (88 mcg total) by  mouth daily. 90 tablet 3  . Multiple Vitamin (MULTIVITAMIN) tablet Take 1 tablet by mouth daily.     No facility-administered medications prior to visit.    Review of Systems;  Patient denies headache, fevers, malaise, unintentional weight loss, skin rash, eye pain, sinus congestion and sinus pain, sore throat, dysphagia,  hemoptysis , cough, dyspnea, wheezing, chest pain, palpitations, orthopnea, edema, abdominal pain, nausea, melena, diarrhea, constipation, flank pain, dysuria, hematuria, urinary  Frequency, nocturia, numbness, tingling, seizures,  Focal weakness, Loss of consciousness,  Tremor, insomnia, depression, anxiety, and suicidal ideation.      Objective:  BP 102/80 (BP Location: Left Arm, Patient Position: Sitting, Cuff Size: Normal)   Pulse 82   Temp 98 F (36.7 C) (Oral)   Resp 14   Ht 4\' 11"  (1.499 m)   Wt 117 lb 12.8 oz (53.4 kg)   SpO2 99%   BMI 23.79 kg/m   BP Readings from Last 3 Encounters:  08/13/20 102/80  04/23/19 100/78  09/02/17 116/78    Wt Readings from Last 3 Encounters:  08/13/20 117 lb 12.8 oz (53.4 kg)  03/19/20 105 lb (47.6 kg)  04/23/19 118 lb 12.8 oz (53.9 kg)    General appearance: alert, cooperative and appears stated age Ears: normal TM's and external ear canals both ears Throat: lips, mucosa, and tongue normal; teeth and gums normal Neck: no adenopathy,  no carotid bruit, supple, symmetrical, trachea midline and thyroid not enlarged, symmetric, no tenderness/mass/nodules Back: symmetric, no curvature. ROM normal. No CVA tenderness. Lungs: clear to auscultation bilaterally Heart: regular rate and rhythm, S1, S2 normal, no murmur, click, rub or gallop Abdomen: soft, non-tender; bowel sounds normal; no masses,  no organomegaly Pulses: 2+ and symmetric Skin: small puncture wound left buttock/perineal area. skin color, texture, turgor normal. No rashes or lesions Lymph nodes: Cervical, supraclavicular, and axillary nodes normal.  No  results found for: HGBA1C  Lab Results  Component Value Date   CREATININE 0.63 04/15/2020   CREATININE 0.58 12/29/2018   CREATININE 0.67 08/05/2017    Lab Results  Component Value Date   WBC 6.5 08/13/2020   HGB 12.8 08/13/2020   HCT 37.8 08/13/2020   PLT 340.0 08/13/2020   GLUCOSE 76 04/15/2020   CHOL 212 (H) 04/15/2020   TRIG 83.0 04/15/2020   HDL 58.80 04/15/2020   LDLCALC 137 (H) 04/15/2020   ALT 13 04/15/2020   AST 22 04/15/2020   NA 140 04/15/2020   K 4.1 04/15/2020   CL 103 04/15/2020   CREATININE 0.63 04/15/2020   BUN 16 04/15/2020   CO2 31 04/15/2020   TSH 2.46 08/08/2020    MM 3D SCREEN BREAST W/IMPLANT BILATERAL  Result Date: 05/21/2020 CLINICAL DATA:  Screening. EXAM: DIGITAL SCREENING BILATERAL MAMMOGRAM WITH IMPLANTS, CAD AND TOMO The patient has retropectoral implants. Standard and implant displaced views were performed. COMPARISON:  Previous exam(s). ACR Breast Density Category d: The breast tissue is extremely dense, which lowers the sensitivity of mammography. FINDINGS: There are no findings suspicious for malignancy. Images were processed with CAD. IMPRESSION: No mammographic evidence of malignancy. A result letter of this screening mammogram will be mailed directly to the patient. RECOMMENDATION: Screening mammogram in one year. (Code:SM-B-01Y) BI-RADS CATEGORY  1:  Negative. Electronically Signed   By: Kristopher Oppenheim M.D.   On: 05/21/2020 11:59    Assessment & Plan:   Problem List Items Addressed This Visit      Unprioritized   Puncture wound of buttock    By barbed wire.  Tetanus vaccine given,  Empiric augmentin prescribed given the nature of the wound .  Daily use of a probiotic advised for 3 weeks.       Fatigue    Screening labs normal.  No history of snoring.  Recommended participating in regular exercise program with goal of achieving a minimum of 30 minutes of aerobic activity 5 days per week.   Lab Results  Component Value Date    CREATININE 0.79 07/07/2015   Lab Results  Component Value Date   ALT 14 07/07/2015   AST 18 07/07/2015   ALKPHOS 69 07/07/2015   BILITOT 0.6 07/07/2015   Lab Results  Component Value Date   TSH 1.68 07/07/2015   Lab Results  Component Value Date   WBC 8.6 07/07/2015   HGB 14.7 07/07/2015   HCT 43.8 07/07/2015   MCV 92.7 07/07/2015   PLT 248.0 07/07/2015         Other Visit Diagnoses    Cervical lymphadenopathy    -  Primary   Pharyngitis, unspecified etiology       Relevant Orders   Monospot (Completed)   SARS-CoV-2 Semi-Quantitative Total Antibody, Spike   CBC with Differential/Platelet (Completed)   Need for Td vaccine       Relevant Orders   Td : Tetanus/diphtheria >7yo Preservative  free (Completed)      I am  having Levert Feinstein start on amoxicillin-clavulanate. I am also having her maintain her multivitamin and levothyroxine.  Meds ordered this encounter  Medications  . amoxicillin-clavulanate (AUGMENTIN) 875-125 MG tablet    Sig: Take 1 tablet by mouth 2 (two) times daily.    Dispense:  14 tablet    Refill:  0    There are no discontinued medications.  Follow-up: No follow-ups on file.   Crecencio Mc, MD

## 2020-08-13 NOTE — Patient Instructions (Signed)
Take  Augmentin twice daily with food for 7 days for the puncture wound   Tetanus booster given   Please take a probiotic ( Align, Floraque or Culturelle) or eat a serving of yogurt daily  For a minimum of 3 weeks to prevent a serious antibiotic associated diarrhea  Called clostridium dificile colitis

## 2020-08-14 ENCOUNTER — Encounter: Payer: Self-pay | Admitting: Internal Medicine

## 2020-08-14 DIAGNOSIS — S31803A Puncture wound without foreign body of unspecified buttock, initial encounter: Secondary | ICD-10-CM

## 2020-08-14 HISTORY — DX: Puncture wound without foreign body of unspecified buttock, initial encounter: S31.803A

## 2020-08-14 NOTE — Assessment & Plan Note (Signed)
Screening labs normal.  No history of snoring.  Recommended participating in regular exercise program with goal of achieving a minimum of 30 minutes of aerobic activity 5 days per week.   Lab Results  Component Value Date   CREATININE 0.79 07/07/2015   Lab Results  Component Value Date   ALT 14 07/07/2015   AST 18 07/07/2015   ALKPHOS 69 07/07/2015   BILITOT 0.6 07/07/2015   Lab Results  Component Value Date   TSH 1.68 07/07/2015   Lab Results  Component Value Date   WBC 8.6 07/07/2015   HGB 14.7 07/07/2015   HCT 43.8 07/07/2015   MCV 92.7 07/07/2015   PLT 248.0 07/07/2015    

## 2020-08-14 NOTE — Assessment & Plan Note (Signed)
By barbed wire.  Tetanus vaccine given,  Empiric augmentin prescribed given the nature of the wound .  Daily use of a probiotic advised for 3 weeks.

## 2020-08-19 LAB — SARS-COV-2 SEMI-QUANTITATIVE TOTAL ANTIBODY, SPIKE: SARS COV2 AB, Total Spike Semi QN: >2500 U/mL — ABNORMAL HIGH (ref <0.8)

## 2020-09-24 ENCOUNTER — Other Ambulatory Visit: Payer: Self-pay

## 2020-09-24 DIAGNOSIS — N952 Postmenopausal atrophic vaginitis: Secondary | ICD-10-CM | POA: Diagnosis not present

## 2020-09-24 DIAGNOSIS — Z01419 Encounter for gynecological examination (general) (routine) without abnormal findings: Secondary | ICD-10-CM | POA: Diagnosis not present

## 2020-09-24 DIAGNOSIS — Z780 Asymptomatic menopausal state: Secondary | ICD-10-CM | POA: Diagnosis not present

## 2020-09-24 MED ORDER — PREMARIN 0.625 MG/GM VA CREA
0.5000 g | TOPICAL_CREAM | VAGINAL | 11 refills | Status: DC
  Filled 2020-09-24: qty 30, 90d supply, fill #0
  Filled 2020-09-26: qty 30, 30d supply, fill #0

## 2020-09-26 ENCOUNTER — Other Ambulatory Visit: Payer: Self-pay

## 2020-10-06 ENCOUNTER — Other Ambulatory Visit: Payer: Self-pay

## 2020-10-06 MED FILL — Levothyroxine Sodium Tab 88 MCG: ORAL | 90 days supply | Qty: 90 | Fill #0 | Status: AC

## 2021-01-11 ENCOUNTER — Other Ambulatory Visit: Payer: Self-pay | Admitting: Internal Medicine

## 2021-01-12 ENCOUNTER — Other Ambulatory Visit: Payer: Self-pay

## 2021-01-12 MED ORDER — LEVOTHYROXINE SODIUM 88 MCG PO TABS
ORAL_TABLET | Freq: Every day | ORAL | 3 refills | Status: DC
  Filled 2021-01-12: qty 90, 90d supply, fill #0
  Filled 2021-04-15: qty 90, 90d supply, fill #1
  Filled 2021-07-21: qty 90, 90d supply, fill #2
  Filled 2021-10-28: qty 90, 90d supply, fill #3

## 2021-01-13 ENCOUNTER — Other Ambulatory Visit: Payer: Self-pay

## 2021-04-15 ENCOUNTER — Other Ambulatory Visit: Payer: Self-pay

## 2021-05-08 ENCOUNTER — Other Ambulatory Visit: Payer: Self-pay | Admitting: Internal Medicine

## 2021-05-08 DIAGNOSIS — Z1231 Encounter for screening mammogram for malignant neoplasm of breast: Secondary | ICD-10-CM

## 2021-05-26 ENCOUNTER — Other Ambulatory Visit: Payer: Self-pay

## 2021-05-26 ENCOUNTER — Ambulatory Visit
Admission: RE | Admit: 2021-05-26 | Discharge: 2021-05-26 | Disposition: A | Discharge status: Home / Self Care | Payer: 59 | Source: Ambulatory Visit | Attending: Internal Medicine | Admitting: Internal Medicine

## 2021-05-26 DIAGNOSIS — Z1231 Encounter for screening mammogram for malignant neoplasm of breast: Secondary | ICD-10-CM | POA: Insufficient documentation

## 2021-06-30 ENCOUNTER — Telehealth: Payer: Self-pay | Admitting: Internal Medicine

## 2021-06-30 DIAGNOSIS — R7301 Impaired fasting glucose: Secondary | ICD-10-CM

## 2021-06-30 DIAGNOSIS — R5383 Other fatigue: Secondary | ICD-10-CM

## 2021-06-30 DIAGNOSIS — E785 Hyperlipidemia, unspecified: Secondary | ICD-10-CM

## 2021-06-30 DIAGNOSIS — E039 Hypothyroidism, unspecified: Secondary | ICD-10-CM

## 2021-06-30 NOTE — Telephone Encounter (Signed)
Pt called in requesting lab order to be place for thyroid check. Pt stated that she always get her thyroid checked around 6 months. Pt was wondering if Dr. Derrel Nip needed other labs drawn. Pt requesting callback.

## 2021-07-02 NOTE — Telephone Encounter (Signed)
Pt called in wanting to know if she was due for labs. Pt has not had labs done since 08/2020. I have ordered lipid panel, cbc, cmp, tsh and A1c. Is there anything else that needs to be ordered?

## 2021-07-02 NOTE — Telephone Encounter (Signed)
Patient called in today still requesting lab order as previously stated and to be place for thyroid check. Joy Horton stated that she always get her thyroid checked around 6 months. Joy Horton was wondering if Dr. Derrel Nip needed other labs drawn. Joy Horton requesting callback to [phone]

## 2021-07-09 ENCOUNTER — Other Ambulatory Visit: Payer: Self-pay

## 2021-07-09 ENCOUNTER — Other Ambulatory Visit (INDEPENDENT_AMBULATORY_CARE_PROVIDER_SITE_OTHER): Payer: 59

## 2021-07-09 DIAGNOSIS — R7301 Impaired fasting glucose: Secondary | ICD-10-CM

## 2021-07-09 DIAGNOSIS — E039 Hypothyroidism, unspecified: Secondary | ICD-10-CM | POA: Diagnosis not present

## 2021-07-09 DIAGNOSIS — R5383 Other fatigue: Secondary | ICD-10-CM | POA: Diagnosis not present

## 2021-07-09 DIAGNOSIS — E785 Hyperlipidemia, unspecified: Secondary | ICD-10-CM

## 2021-07-09 LAB — HEMOGLOBIN A1C: Hgb A1c MFr Bld: 5.8 % (ref 4.6–6.5)

## 2021-07-09 LAB — COMPREHENSIVE METABOLIC PANEL
ALT: 15 U/L (ref 0–35)
AST: 23 U/L (ref 0–37)
Albumin: 4.4 g/dL (ref 3.5–5.2)
Alkaline Phosphatase: 58 U/L (ref 39–117)
BUN: 21 mg/dL (ref 6–23)
CO2: 31 mEq/L (ref 19–32)
Calcium: 9.6 mg/dL (ref 8.4–10.5)
Chloride: 103 mEq/L (ref 96–112)
Creatinine, Ser: 0.69 mg/dL (ref 0.40–1.20)
GFR: 92.83 mL/min (ref >60.00)
Glucose, Bld: 93 mg/dL (ref 70–99)
Potassium: 4.2 mEq/L (ref 3.5–5.1)
Sodium: 141 mEq/L (ref 135–145)
Total Bilirubin: 0.7 mg/dL (ref 0.2–1.2)
Total Protein: 6.7 g/dL (ref 6.0–8.3)

## 2021-07-09 LAB — CBC WITH DIFFERENTIAL/PLATELET
Basophils Absolute: 0.1 10*3/uL (ref 0.0–0.1)
Basophils Relative: 1.2 % (ref 0.0–3.0)
Eosinophils Absolute: 0.1 10*3/uL (ref 0.0–0.7)
Eosinophils Relative: 2.5 % (ref 0.0–5.0)
HCT: 38.6 % (ref 36.0–46.0)
Hemoglobin: 13 g/dL (ref 12.0–15.0)
Lymphocytes Relative: 25.6 % (ref 12.0–46.0)
Lymphs Abs: 1.5 10*3/uL (ref 0.7–4.0)
MCHC: 33.6 g/dL (ref 30.0–36.0)
MCV: 92.8 fl (ref 78.0–100.0)
Monocytes Absolute: 0.4 10*3/uL (ref 0.1–1.0)
Monocytes Relative: 7.5 % (ref 3.0–12.0)
Neutro Abs: 3.7 10*3/uL (ref 1.4–7.7)
Neutrophils Relative %: 63.2 % (ref 43.0–77.0)
Platelets: 360 10*3/uL (ref 150.0–400.0)
RBC: 4.16 Mil/uL (ref 3.87–5.11)
RDW: 12.9 % (ref 11.5–15.5)
WBC: 5.8 10*3/uL (ref 4.0–10.5)

## 2021-07-09 LAB — LIPID PANEL
Cholesterol: 230 mg/dL — ABNORMAL HIGH (ref 0–200)
HDL: 58.3 mg/dL (ref >39.00)
LDL Cholesterol: 151 mg/dL — ABNORMAL HIGH (ref 0–99)
NonHDL: 171.91
Total CHOL/HDL Ratio: 4
Triglycerides: 105 mg/dL (ref 0.0–149.0)
VLDL: 21 mg/dL (ref 0.0–40.0)

## 2021-07-09 LAB — TSH: TSH: 2.65 u[IU]/mL (ref 0.35–5.50)

## 2021-07-21 ENCOUNTER — Other Ambulatory Visit: Payer: Self-pay

## 2021-07-22 ENCOUNTER — Other Ambulatory Visit: Payer: Self-pay

## 2021-07-23 ENCOUNTER — Other Ambulatory Visit: Payer: Self-pay

## 2021-09-04 LAB — HM PAP SMEAR: HM Pap smear: NORMAL

## 2021-09-04 LAB — HM MAMMOGRAPHY: HM Mammogram: NORMAL (ref 0–4)

## 2021-09-28 DIAGNOSIS — N952 Postmenopausal atrophic vaginitis: Secondary | ICD-10-CM | POA: Diagnosis not present

## 2021-09-28 DIAGNOSIS — L900 Lichen sclerosus et atrophicus: Secondary | ICD-10-CM | POA: Diagnosis not present

## 2021-09-28 DIAGNOSIS — Z01419 Encounter for gynecological examination (general) (routine) without abnormal findings: Secondary | ICD-10-CM | POA: Diagnosis not present

## 2021-09-28 DIAGNOSIS — E039 Hypothyroidism, unspecified: Secondary | ICD-10-CM | POA: Diagnosis not present

## 2021-09-28 DIAGNOSIS — Z780 Asymptomatic menopausal state: Secondary | ICD-10-CM | POA: Diagnosis not present

## 2021-09-28 LAB — HM PAP SMEAR: HM Pap smear: NORMAL

## 2021-10-16 ENCOUNTER — Other Ambulatory Visit: Payer: Self-pay

## 2021-10-16 MED ORDER — PREMARIN 0.625 MG/GM VA CREA
TOPICAL_CREAM | VAGINAL | 11 refills | Status: DC
  Filled 2021-10-16: qty 30, 30d supply, fill #0

## 2021-10-28 ENCOUNTER — Other Ambulatory Visit: Payer: Self-pay

## 2021-11-03 ENCOUNTER — Other Ambulatory Visit: Payer: Self-pay

## 2022-01-04 ENCOUNTER — Other Ambulatory Visit: Payer: Self-pay

## 2022-01-04 ENCOUNTER — Ambulatory Visit: Payer: 59 | Admitting: Internal Medicine

## 2022-01-04 ENCOUNTER — Encounter: Payer: Self-pay | Admitting: Internal Medicine

## 2022-01-04 VITALS — BP 100/64 | HR 81 | Temp 97.9°F | Resp 20 | Ht 59.0 in | Wt 120.8 lb

## 2022-01-04 DIAGNOSIS — Z0000 Encounter for general adult medical examination without abnormal findings: Secondary | ICD-10-CM | POA: Diagnosis not present

## 2022-01-04 DIAGNOSIS — E7800 Pure hypercholesterolemia, unspecified: Secondary | ICD-10-CM

## 2022-01-04 DIAGNOSIS — Z1211 Encounter for screening for malignant neoplasm of colon: Secondary | ICD-10-CM

## 2022-01-04 DIAGNOSIS — Z1231 Encounter for screening mammogram for malignant neoplasm of breast: Secondary | ICD-10-CM | POA: Diagnosis not present

## 2022-01-04 DIAGNOSIS — E039 Hypothyroidism, unspecified: Secondary | ICD-10-CM | POA: Diagnosis not present

## 2022-01-04 DIAGNOSIS — R5383 Other fatigue: Secondary | ICD-10-CM | POA: Diagnosis not present

## 2022-01-04 DIAGNOSIS — F419 Anxiety disorder, unspecified: Secondary | ICD-10-CM | POA: Diagnosis not present

## 2022-01-04 MED ORDER — DOXYCYCLINE HYCLATE 100 MG PO TABS
100.0000 mg | ORAL_TABLET | Freq: Two times a day (BID) | ORAL | 0 refills | Status: DC
  Filled 2022-01-04: qty 20, 10d supply, fill #0

## 2022-01-04 MED ORDER — ZOSTER VAC RECOMB ADJUVANTED 50 MCG/0.5ML IM SUSR: 0.5000 mL | Freq: Once | INTRAMUSCULAR | 1 refills | Status: AC

## 2022-01-04 NOTE — Patient Instructions (Addendum)
COLOGUARD IS DUE EVERY 3 YEARS. AND IS DUE NOW

## 2022-01-04 NOTE — Assessment & Plan Note (Signed)
Thyroid function is WNL on current dose.  No current changes needed.   Lab Results  Component Value Date   TSH 2.65 07/09/2021

## 2022-01-04 NOTE — Assessment & Plan Note (Signed)

## 2022-01-04 NOTE — Assessment & Plan Note (Signed)
Job related,  Now improved with change in  Venues.

## 2022-01-04 NOTE — Progress Notes (Signed)
The patient is here for annual preventive examination and management of other chronic and acute problems.   The risk factors are reflected in the social history.   The roster of all physicians providing medical care to patient - is listed in the Snapshot section of the chart.   Activities of daily living:  The patient is 100% independent in all ADLs: dressing, toileting, feeding as well as independent mobility   Home safety : The patient has smoke detectors in the home. They wear seatbelts.  There are no unsecured firearms at home. There is no violence in the home.    There is no risks for hepatitis, STDs or HIV. There is no   history of blood transfusion. They have no travel history to infectious disease endemic areas of the world.   The patient has seen their dentist in the last six month. They have seen their eye doctor in the last year. The patinet  denies slight hearing difficulty with regard to whispered voices and some television programs.  They have deferred audiologic testing in the last year.  They do not  have excessive sun exposure. Discussed the need for sun protection: hats, long sleeves and use of sunscreen if there is significant sun exposure.    Diet: the importance of a healthy diet is discussed. They do have a healthy diet.   The benefits of regular aerobic exercise were discussed. The patient  exercises  3 to 5 days per week  for  60 minutes.    Depression screen: there are no signs or vegative symptoms of depression- irritability, change in appetite, anhedonia, sadness/tearfullness.   The following portions of the patient's history were reviewed and updated as appropriate: allergies, current medications, past family history, past medical history,  past surgical history, past social history  and problem list.   Visual acuity was not assessed per patient preference since the patient has regular follow up with an  ophthalmologist. Hearing and body mass index were assessed and  reviewed.    During the course of the visit the patient was educated and counseled about appropriate screening and preventive services including : fall prevention , diabetes screening, nutrition counseling, colorectal cancer screening, and recommended immunizations.    Chief Complaint:   HM:   Had her CPE with PAP /MAMMOGRAM DONE BY DR COUSINS IN MARCH.  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   Very happy working part time and making house calls  Hypothyroidism:  taking levothyroxine   Review of Symptoms  Patient denies headache, fevers, malaise, unintentional weight loss, skin rash, eye pain, sinus congestion and sinus pain, sore throat, dysphagia,  hemoptysis , cough, dyspnea, wheezing, chest pain, palpitations, orthopnea, edema, abdominal pain, nausea, melena, diarrhea, constipation, flank pain, dysuria, hematuria, urinary  Frequency, nocturia, numbness, tingling, seizures,  Focal weakness, Loss of consciousness,  Tremor, insomnia, depression, anxiety, and suicidal ideation.    Physical Exam:  BP 100/64 (BP Location: Left Arm, Patient Position: Sitting, Cuff Size: Normal)   Pulse 81   Temp 97.9 F (36.6 C) (Oral)   Resp 20   Ht '4\' 11"'$  (1.499 m)   Wt 120 lb 12.8 oz (54.8 kg)   SpO2 97%   BMI 24.40 kg/m     General appearance: alert, cooperative  and appears stated age Ears: normal TM's and external ear canals both ears Throat: lips, mucosa, and tongue normal; teeth and gums normal Neck: no adenopathy, no carotid bruit, supple, symmetrical, trachea midline and thyroid not enlarged, symmetric, no tenderness/mass/nodules Back: symmetric, no curvature. ROM normal. No CVA tenderness. Lungs: clear to auscultation bilaterally Heart: regular rate and rhythm,  S1, S2 normal, no murmur, click, rub or gallop Abdomen: soft, non-tender; bowel sounds normal; no masses,  no organomegaly Pulses: 2+ and symmetric Skin: Skin color, texture, turgor normal. No rashes or lesions Lymph nodes: Cervical, supraclavicular, and axillary nodes normal.   Assessment and Plan:  Encounter for preventive health examination age appropriate education and counseling updated, referrals for preventative services and immunizations addressed, dietary and smoking counseling addressed, most recent labs reviewed.  I have personally reviewed and have noted:   1) the patient's medical and social history 2) The pt's use of alcohol, tobacco, and illicit drugs 3) The patient's current medications and supplements 4) Functional ability including ADL's, fall risk, home safety risk, hearing and visual impairment 5) Diet and physical activities 6) Evidence for depression or mood disorder 7) The patient's height, weight, and BMI have been recorded in the chart  I have made referrals, and provided counseling and education based on review of the above  Hypothyroidism Thyroid function is WNL on current dose.  No current changes needed.   Lab Results  Component Value Date   TSH 2.65 07/09/2021     Screening for breast cancer Done by Dr Cousins/   Anxiety Job related,  Now improved with change in  Venues.    Updated Medication List Outpatient Encounter Medications as of 01/04/2022  Medication Sig   doxycycline (VIBRA-TABS) 100 MG tablet Take 1 tablet (100 mg total) by mouth 2 (two) times daily.   Zoster Vaccine Adjuvanted [facility]) injection Inject 0.5 mLs into the muscle once for 1 dose.   conjugated estrogens (PREMARIN) vaginal cream Insert 1/2 Gram(s) Vaginally biweekly   conjugated estrogens (PREMARIN) vaginal cream Insert 1/2 Gram(s) Vaginal biweekly   levothyroxine (SYNTHROID) 88 MCG tablet TAKE 1 TABLET BY MOUTH DAILY.   Multiple Vitamin (MULTIVITAMIN) tablet Take 1  tablet by mouth daily.   No facility-administered encounter medications on file as of 01/04/2022.

## 2022-01-04 NOTE — Assessment & Plan Note (Signed)
Done by Dr Cousins/

## 2022-01-04 NOTE — Progress Notes (Deleted)
Subjective:  Patient ID: Joy Horton, female    DOB: 1960/12/05  Age: 61 y.o. MRN: [id number]  CC: There were no encounter diagnoses.   HPI Joy Horton presents for  Chief Complaint  Patient presents with   Follow-up    Physical    HM:   CPE with PAP /MAMMOGRAM DONE BY DR COUSINS IN [facility].  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   Very happy working part time and making house calls  Hypothyroidism:  taking levothyroxine      Outpatient Medications Prior to Visit  Medication Sig Dispense Refill   conjugated estrogens (PREMARIN) vaginal cream Insert 1/2 Gram(s) Vaginally biweekly 30 g 11   conjugated estrogens (PREMARIN) vaginal cream Insert 1/2 Gram(s) Vaginal biweekly 30 g 11   levothyroxine (SYNTHROID) 88 MCG tablet TAKE 1 TABLET BY MOUTH DAILY. 90 tablet 3   Multiple Vitamin (MULTIVITAMIN) tablet Take 1 tablet by mouth daily.     No facility-administered medications prior to visit.    Review of Systems;  Patient denies headache, fevers, malaise, unintentional weight loss, skin rash, eye pain, sinus congestion and sinus pain, sore throat, dysphagia,  hemoptysis , cough, dyspnea, wheezing, chest pain, palpitations, orthopnea, edema, abdominal pain, nausea, melena, diarrhea, constipation, flank pain, dysuria, hematuria, urinary  Frequency, nocturia, numbness, tingling, seizures,  Focal weakness, Loss of consciousness,  Tremor, insomnia, depression, anxiety, and suicidal ideation.      Objective:  BP 100/64 (BP Location: Left Arm, Patient Position: Sitting, Cuff Size: Normal)   Pulse 81   Temp 97.9 F (36.6 C) (Oral)   Resp 20   Ht '4\' 11"'$  (1.499 m)   Wt 120 lb 12.8 oz (54.8 kg)   SpO2 97%   BMI 24.40 kg/m   BP Readings from Last 3  Encounters:  01/04/22 100/64  08/13/20 102/80  04/23/19 100/78    Wt Readings from Last 3 Encounters:  01/04/22 120 lb 12.8 oz (54.8 kg)  08/13/20 117 lb 12.8 oz (53.4 kg)  03/19/20 105 lb (47.6 kg)    General appearance: alert, cooperative and appears stated age Ears: normal TM's and external ear canals both ears Throat: lips, mucosa, and tongue normal; teeth and gums normal Neck: no adenopathy, no carotid bruit, supple, symmetrical, trachea midline and thyroid not enlarged, symmetric, no tenderness/mass/nodules Back: symmetric, no curvature. ROM normal. No CVA tenderness. Lungs: clear to auscultation bilaterally Heart: regular rate and rhythm, S1, S2 normal, no murmur, click, rub or gallop Abdomen: soft, non-tender; bowel sounds normal; no masses,  no organomegaly Pulses: 2+ and symmetric Skin: Skin color, texture, turgor normal. No rashes or lesions Lymph nodes: Cervical, supraclavicular, and axillary nodes normal.  Lab Results  Component Value Date   HGBA1C 5.8 07/09/2021    Lab Results  Component Value Date   CREATININE 0.69 07/09/2021   CREATININE 0.63 04/15/2020   CREATININE 0.58 12/29/2018    Lab Results  Component Value Date   WBC 5.8 07/09/2021   HGB 13.0 07/09/2021   HCT 38.6 07/09/2021   PLT 360.0 07/09/2021   GLUCOSE 93 07/09/2021   CHOL 230 (H) 07/09/2021   TRIG 105.0 07/09/2021   HDL 58.30 07/09/2021   LDLCALC 151 (H) 07/09/2021   ALT 15 07/09/2021   AST 23 07/09/2021  NA 141 07/09/2021   K 4.2 07/09/2021   CL 103 07/09/2021   CREATININE 0.69 07/09/2021   BUN 21 07/09/2021   CO2 31 07/09/2021   TSH 2.65 07/09/2021   HGBA1C 5.8 07/09/2021    MM 3D SCREEN BREAST W/IMPLANT BILATERAL  Result Date: 05/26/2021 CLINICAL DATA:  Screening. EXAM: DIGITAL SCREENING BILATERAL MAMMOGRAM WITH IMPLANTS, CAD AND TOMOSYNTHESIS TECHNIQUE: Bilateral screening digital craniocaudal and mediolateral oblique mammograms were obtained. Bilateral screening digital  breast tomosynthesis was performed. The images were evaluated with computer-aided detection. Standard and/or implant displaced views were performed. COMPARISON:  Previous exam(s). ACR Breast Density Category d: The breast tissue is extremely dense, which lowers the sensitivity of mammography. FINDINGS: The patient has retropectoral implants. There are no findings suspicious for malignancy. IMPRESSION: No mammographic evidence of malignancy. A result letter of this screening mammogram will be mailed directly to the patient. RECOMMENDATION: Screening mammogram in one year. (Code:SM-B-01Y) BI-RADS CATEGORY  1:  Negative. Electronically Signed   By: Audie Pinto M.D.   On: 05/26/2021 15:57   Assessment & Plan:   Problem List Items Addressed This Visit   None   I spent a total of   minutes with this patient in a face to face visit on the date of this encounter reviewing the last office visit with me on        ,  most recent with patient's cardiologist in    ,  patient'ss diet and eating habits, home blood pressure readings ,  most recent imaging study ,   and post visit ordering of testing and therapeutics.    Follow-up: No follow-ups on file.   Crecencio Mc, MD

## 2022-01-05 ENCOUNTER — Other Ambulatory Visit (INDEPENDENT_AMBULATORY_CARE_PROVIDER_SITE_OTHER): Payer: 59

## 2022-01-05 DIAGNOSIS — R5383 Other fatigue: Secondary | ICD-10-CM

## 2022-01-05 DIAGNOSIS — E7800 Pure hypercholesterolemia, unspecified: Secondary | ICD-10-CM

## 2022-01-05 DIAGNOSIS — E039 Hypothyroidism, unspecified: Secondary | ICD-10-CM

## 2022-01-05 DIAGNOSIS — Z0000 Encounter for general adult medical examination without abnormal findings: Secondary | ICD-10-CM

## 2022-01-05 LAB — CBC WITH DIFFERENTIAL/PLATELET
Basophils Absolute: 0.1 10*3/uL (ref 0.0–0.1)
Basophils Relative: 1.1 % (ref 0.0–3.0)
Eosinophils Absolute: 0.2 10*3/uL (ref 0.0–0.7)
Eosinophils Relative: 2.7 % (ref 0.0–5.0)
HCT: 37.5 % (ref 36.0–46.0)
Hemoglobin: 12.9 g/dL (ref 12.0–15.0)
Lymphocytes Relative: 26.4 % (ref 12.0–46.0)
Lymphs Abs: 1.6 10*3/uL (ref 0.7–4.0)
MCHC: 34.5 g/dL (ref 30.0–36.0)
MCV: 93.6 fl (ref 78.0–100.0)
Monocytes Absolute: 0.5 10*3/uL (ref 0.1–1.0)
Monocytes Relative: 8.2 % (ref 3.0–12.0)
Neutro Abs: 3.7 10*3/uL (ref 1.4–7.7)
Neutrophils Relative %: 61.6 % (ref 43.0–77.0)
Platelets: 331 10*3/uL (ref 150.0–400.0)
RBC: 4.01 Mil/uL (ref 3.87–5.11)
RDW: 12.8 % (ref 11.5–15.5)
WBC: 6.1 10*3/uL (ref 4.0–10.5)

## 2022-01-05 LAB — COMPREHENSIVE METABOLIC PANEL
ALT: 14 U/L (ref 0–35)
AST: 23 U/L (ref 0–37)
Albumin: 4.4 g/dL (ref 3.5–5.2)
Alkaline Phosphatase: 52 U/L (ref 39–117)
BUN: 23 mg/dL (ref 6–23)
CO2: 27 mEq/L (ref 19–32)
Calcium: 9.5 mg/dL (ref 8.4–10.5)
Chloride: 104 mEq/L (ref 96–112)
Creatinine, Ser: 0.65 mg/dL (ref 0.40–1.20)
GFR: 93.85 mL/min (ref >60.00)
Glucose, Bld: 97 mg/dL (ref 70–99)
Potassium: 4.1 mEq/L (ref 3.5–5.1)
Sodium: 139 mEq/L (ref 135–145)
Total Bilirubin: 0.3 mg/dL (ref 0.2–1.2)
Total Protein: 6.6 g/dL (ref 6.0–8.3)

## 2022-01-05 LAB — LIPID PANEL
Cholesterol: 224 mg/dL — ABNORMAL HIGH (ref 0–200)
HDL: 48.5 mg/dL (ref >39.00)
LDL Cholesterol: 151 mg/dL — ABNORMAL HIGH (ref 0–99)
NonHDL: 175.47
Total CHOL/HDL Ratio: 5
Triglycerides: 123 mg/dL (ref 0.0–149.0)
VLDL: 24.6 mg/dL (ref 0.0–40.0)

## 2022-01-05 LAB — TSH: TSH: 2.14 u[IU]/mL (ref 0.35–5.50)

## 2022-01-06 ENCOUNTER — Encounter: Payer: Self-pay | Admitting: Internal Medicine

## 2022-01-06 NOTE — Assessment & Plan Note (Signed)
age appropriate education and counseling updated, referrals for preventative services and immunizations addressed, dietary and smoking counseling addressed, most recent labs reviewed.  I have personally reviewed and have noted:   1) the patient's medical and social history 2) The pt's use of alcohol, tobacco, and illicit drugs 3) The patient's current medications and supplements 4) Functional ability including ADL's, fall risk, home safety risk, hearing and visual impairment 5) Diet and physical activities 6) Evidence for depression or mood disorder 7) The patient's height, weight, and BMI have been recorded in the chart   Lipid profile :  10 yr risk is 3% using the [facility] calculator   I have made referrals, and provided counseling and education based on review of the above

## 2022-01-28 ENCOUNTER — Other Ambulatory Visit: Payer: Self-pay | Admitting: Internal Medicine

## 2022-01-28 ENCOUNTER — Other Ambulatory Visit: Payer: Self-pay

## 2022-01-28 MED ORDER — LEVOTHYROXINE SODIUM 88 MCG PO TABS
ORAL_TABLET | Freq: Every day | ORAL | 3 refills | Status: DC
  Filled 2022-01-28: qty 90, 90d supply, fill #0
  Filled 2022-05-10: qty 90, 90d supply, fill #1
  Filled 2022-08-05: qty 30, 30d supply, fill #2
  Filled 2022-08-06: qty 90, 90d supply, fill #2
  Filled 2022-11-29: qty 30, 30d supply, fill #3

## 2022-03-25 DIAGNOSIS — Z1211 Encounter for screening for malignant neoplasm of colon: Secondary | ICD-10-CM | POA: Diagnosis not present

## 2022-04-02 LAB — COLOGUARD: COLOGUARD: NEGATIVE

## 2022-05-06 ENCOUNTER — Other Ambulatory Visit: Payer: Self-pay | Admitting: Internal Medicine

## 2022-05-06 DIAGNOSIS — Z1231 Encounter for screening mammogram for malignant neoplasm of breast: Secondary | ICD-10-CM

## 2022-05-10 ENCOUNTER — Other Ambulatory Visit: Payer: Self-pay

## 2022-05-11 ENCOUNTER — Other Ambulatory Visit: Payer: Self-pay

## 2022-06-03 ENCOUNTER — Ambulatory Visit
Admission: RE | Admit: 2022-06-03 | Discharge: 2022-06-03 | Disposition: A | Discharge status: Home / Self Care | Payer: 59 | Source: Ambulatory Visit | Attending: Internal Medicine | Admitting: Internal Medicine

## 2022-06-03 DIAGNOSIS — Z1231 Encounter for screening mammogram for malignant neoplasm of breast: Secondary | ICD-10-CM | POA: Diagnosis not present

## 2022-06-11 ENCOUNTER — Encounter: Payer: Self-pay | Admitting: Family Medicine

## 2022-06-11 ENCOUNTER — Telehealth (INDEPENDENT_AMBULATORY_CARE_PROVIDER_SITE_OTHER): Payer: Self-pay | Admitting: Family Medicine

## 2022-06-11 DIAGNOSIS — J029 Acute pharyngitis, unspecified: Secondary | ICD-10-CM

## 2022-06-11 DIAGNOSIS — J069 Acute upper respiratory infection, unspecified: Secondary | ICD-10-CM

## 2022-06-11 DIAGNOSIS — R52 Pain, unspecified: Secondary | ICD-10-CM

## 2022-06-11 DIAGNOSIS — R6883 Chills (without fever): Secondary | ICD-10-CM

## 2022-06-11 NOTE — Progress Notes (Signed)
MyChart Video Visit    Virtual Visit via Video Note   This visit type was conducted due to national recommendations for restrictions regarding the COVID-19 Pandemic (e.g. social distancing) in an effort to limit this patient's exposure and mitigate transmission in our community. This patient is at least at moderate risk for complications without adequate follow up. This format is felt to be most appropriate for this patient at this time. Physical exam was limited by quality of the video and audio technology used for the visit. CMA was able to get the patient set up on a video visit.  Patient location: Home. Patient and provider in visit Provider location: Office  I discussed the limitations of evaluation and management by telemedicine and the availability of in person appointments. The patient expressed understanding and agreed to proceed.  Visit Date: 06/11/2022  Today's healthcare provider: Harland Dingwall, NP-C     Subjective:    Patient ID: Joy Horton, female    DOB: June 21, 1960, 62 y.o.   MRN: [id number]  Chief Complaint  Patient presents with   Nasal Congestion    About a week and a half ago   Fatigue    Started Wednesday   Sore Throat    Denies cough, started a week and half ago   Chills    HPI  C/o chills, body aches, nasal congestion, sinus pressure on left since yesterday. She has had sore throat for approx 1 wk.   Taking Nyquil, Tylenol, Advil and doing salt water gargles.   She will go get a Covid test to test herself. She has not had Covid in the past.  Is fully vaccinated.  Also vaccinated for flu.   No dizziness, chest pain, shortness of breath, N/V/D   Past Medical History:  Diagnosis Date   GERD (gastroesophageal reflux disease)    infrequent   hypothyroid    Tick fever 12/23/2015    Past Surgical History:  Procedure Laterality Date   AUGMENTATION MAMMAPLASTY Bilateral 1995    Family History  Problem Relation Age of Onset   Diabetes  Mother    Arthritis Mother     Social History   Socioeconomic History   Marital status: Married    Spouse name: Not on file   Number of children: Not on file   Years of education: Not on file   Highest education level: Not on file  Occupational History   Not on file  Tobacco Use   Smoking status: Never   Smokeless tobacco: Never  Substance and Sexual Activity   Alcohol use: Yes    Alcohol/week: 5.0 standard drinks of alcohol    Types: 5 drink(s) per week   Drug use: No   Sexual activity: Yes    Birth control/protection: Post-menopausal  Other Topics Concern   Not on file  Social History Narrative   Not on file   Social Determinants of Health   Financial Resource Strain: Not on file  Food Insecurity: Not on file  Transportation Needs: Not on file  Physical Activity: Not on file  Stress: Not on file  Social Connections: Not on file  Intimate Partner Violence: Not on file    Outpatient Medications Prior to Visit  Medication Sig Dispense Refill   levothyroxine (SYNTHROID) 88 MCG tablet TAKE 1 TABLET BY MOUTH DAILY. 90 tablet 3   Multiple Vitamin (MULTIVITAMIN) tablet Take 1 tablet by mouth daily.     doxycycline (VIBRA-TABS) 100 MG tablet Take 1 tablet (100 mg total)  by mouth 2 (two) times daily. (Patient not taking: Reported on 06/11/2022) 20 tablet 0   conjugated estrogens (PREMARIN) vaginal cream Insert 1/2 Gram(s) Vaginally biweekly 30 g 11   conjugated estrogens (PREMARIN) vaginal cream Insert 1/2 Gram(s) Vaginal biweekly 30 g 11   No facility-administered medications prior to visit.    Not on File  ROS     Objective:    Physical Exam  There were no vitals taken for this visit. Wt Readings from Last 3 Encounters:  01/04/22 120 lb 12.8 oz (54.8 kg)  08/13/20 117 lb 12.8 oz (53.4 kg)  03/19/20 105 lb (47.6 kg)   Alert and oriented and in no acute distress.  Respirations unlabored.  Normal speech and mood.     Assessment & Plan:   Problem List Items  Addressed This Visit   None Visit Diagnoses     Acute URI    -  Primary   Acute pharyngitis, unspecified etiology       Body aches       Chills          Discussed that she appears to have a viral illness.  She does not have a COVID test at home but will go get one.  She will let me know if it is positive.  Discussed symptomatic management. Discussed going to urgent care over the weekend if she is getting significantly worse. Recommend good hydration, rest and Tylenol or Advil.  Continue salt water gargles.   I have discontinued Dreama Saa. Zuercher's Premarin and Premarin. I am also having her maintain her multivitamin, doxycycline, and levothyroxine.  No orders of the defined types were placed in this encounter.   I discussed the assessment and treatment plan with the patient. The patient was provided an opportunity to ask questions and all were answered. The patient agreed with the plan and demonstrated an understanding of the instructions.   The patient was advised to call back or seek an in-person evaluation if the symptoms worsen or if the condition fails to improve as anticipated.  I provided 16 minutes of face-to-face time during this encounter.   Harland Dingwall, NP-C [employer] [place] [phone] (phone) [phone] (fax)  [place]

## 2022-06-16 ENCOUNTER — Other Ambulatory Visit: Payer: Self-pay

## 2022-06-25 ENCOUNTER — Other Ambulatory Visit: Payer: Self-pay

## 2022-08-05 ENCOUNTER — Other Ambulatory Visit: Payer: Self-pay

## 2022-08-06 ENCOUNTER — Other Ambulatory Visit: Payer: Self-pay

## 2022-08-10 ENCOUNTER — Other Ambulatory Visit: Payer: Self-pay

## 2022-08-11 ENCOUNTER — Other Ambulatory Visit (HOSPITAL_BASED_OUTPATIENT_CLINIC_OR_DEPARTMENT_OTHER): Payer: Self-pay

## 2022-08-16 ENCOUNTER — Other Ambulatory Visit: Payer: Self-pay

## 2022-09-06 DIAGNOSIS — M79671 Pain in right foot: Secondary | ICD-10-CM | POA: Diagnosis not present

## 2022-09-06 DIAGNOSIS — M2011 Hallux valgus (acquired), right foot: Secondary | ICD-10-CM | POA: Diagnosis not present

## 2022-09-06 DIAGNOSIS — M2021 Hallux rigidus, right foot: Secondary | ICD-10-CM | POA: Diagnosis not present

## 2022-09-06 DIAGNOSIS — R262 Difficulty in walking, not elsewhere classified: Secondary | ICD-10-CM | POA: Diagnosis not present

## 2022-09-13 DIAGNOSIS — R262 Difficulty in walking, not elsewhere classified: Secondary | ICD-10-CM | POA: Diagnosis not present

## 2022-09-13 DIAGNOSIS — M2011 Hallux valgus (acquired), right foot: Secondary | ICD-10-CM | POA: Diagnosis not present

## 2022-09-13 DIAGNOSIS — M79671 Pain in right foot: Secondary | ICD-10-CM | POA: Diagnosis not present

## 2022-09-13 DIAGNOSIS — M2021 Hallux rigidus, right foot: Secondary | ICD-10-CM | POA: Diagnosis not present

## 2022-09-30 ENCOUNTER — Ambulatory Visit: Payer: Commercial Managed Care - PPO | Admitting: Podiatry

## 2022-09-30 ENCOUNTER — Encounter: Payer: Self-pay | Admitting: Podiatry

## 2022-09-30 ENCOUNTER — Ambulatory Visit (INDEPENDENT_AMBULATORY_CARE_PROVIDER_SITE_OTHER): Payer: Commercial Managed Care - PPO

## 2022-09-30 DIAGNOSIS — M21612 Bunion of left foot: Secondary | ICD-10-CM | POA: Diagnosis not present

## 2022-09-30 DIAGNOSIS — M21611 Bunion of right foot: Secondary | ICD-10-CM

## 2022-09-30 DIAGNOSIS — M21619 Bunion of unspecified foot: Secondary | ICD-10-CM

## 2022-09-30 NOTE — Progress Notes (Signed)
Subjective:   Patient ID: Joy Horton, female   DOB: 62 y.o.   MRN: [id number]   HPI Patient presents with painful bunion deformity right over left that is been present for a long time stating that its gotten worse over the last year and she gets pain within the joint she gets pain when she wears shoes and any type of walking.  Also notes occasional cramping and stiffness.  Patient does not smoke likes to be active   Review of Systems  All other systems reviewed and are negative.       Objective:  Physical Exam Vitals and nursing note reviewed.  Constitutional:      Appearance: She is well-developed.  Pulmonary:     Effort: Pulmonary effort is normal.  Musculoskeletal:        General: Normal range of motion.  Skin:    General: Skin is warm.  Neurological:     Mental Status: She is alert.     Neurovascular status intact muscle strength found to be adequate range of motion adequate with patient found to have hyperostosis medial aspect first metatarsal head right over left and what appears to be dorsal spur formation first metatarsal right with a hypermobile first metatarsal and functional reduction of motion with no crepitus of the joint right over left.  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  At this point I allowed her to read consent form for biplanar osteotomy first metatarsal to both reduce the IM angle lower the bone and slightly shortened.  I explained the procedure to the patient she is willing to accept risk wants surgery and after extensive review she  signed consent form and is scheduled for outpatient surgery.  All questions answered she understands no guarantee of success total recovery can take 6 months and at this point I did dispense air fracture walker that I fitted properly to the lower leg gave instructions for wearing and I want her to get used to it prior to the procedure in the next few weeks.  Encouraged to call questions concerns which may arise  X-rays indicate that there is hypermobility elevation first metatarsal segment there is elevation of the intermetatarsal angle of approximate 15 degrees in the beginning of dorsal spurring right over left

## 2022-10-04 ENCOUNTER — Telehealth: Payer: Self-pay | Admitting: Urology

## 2022-10-04 NOTE — Telephone Encounter (Signed)
DOS - 10/19/22   [place] BUNIONECTOMY RIGHT --- 28413  [employer] EFFECTIVE DATE - 06/07/22   DEDUCTIBLE -  $900.00 W/ $400.00 REMAINING OOP - $9,450.00 W/ $9,450.00 REMAINING COINSURANCE - 20%  SPOKE WITH HARRY A. WITH [employer] AND HE STATED THAT FOR CPT CODE 24401 NO PRIOR AUTH IS REQUIRED.   CALL REF # [id number]

## 2022-10-06 HISTORY — PX: METATARSAL OSTEOTOMY WITH BUNIONECTOMY: SHX5662

## 2022-10-11 ENCOUNTER — Telehealth: Payer: Self-pay | Admitting: Podiatry

## 2022-10-11 NOTE — Telephone Encounter (Signed)
Patient would like to speak with someone concerning how long she will be out of work? And what type of limitations she will have after surgery.

## 2022-10-11 NOTE — Telephone Encounter (Signed)
I left her a message.

## 2022-10-18 ENCOUNTER — Other Ambulatory Visit: Payer: Self-pay

## 2022-10-18 MED ORDER — OXYCODONE-ACETAMINOPHEN 10-325 MG PO TABS
1.0000 | ORAL_TABLET | ORAL | 0 refills | Status: DC | PRN
  Filled 2022-10-18: qty 20, 4d supply, fill #0

## 2022-10-18 MED ORDER — ONDANSETRON HCL 4 MG PO TABS
4.0000 mg | ORAL_TABLET | Freq: Three times a day (TID) | ORAL | 0 refills | Status: DC | PRN
  Filled 2022-10-18: qty 20, 7d supply, fill #0

## 2022-10-18 NOTE — Addendum Note (Signed)
Addended by: Lenn Sink on: 10/18/2022 02:24 PM   Modules accepted: Orders

## 2022-10-19 DIAGNOSIS — M205X1 Other deformities of toe(s) (acquired), right foot: Secondary | ICD-10-CM | POA: Diagnosis not present

## 2022-10-19 DIAGNOSIS — M2011 Hallux valgus (acquired), right foot: Secondary | ICD-10-CM | POA: Diagnosis not present

## 2022-10-19 DIAGNOSIS — M2021 Hallux rigidus, right foot: Secondary | ICD-10-CM | POA: Diagnosis not present

## 2022-10-19 DIAGNOSIS — M7751 Other enthesopathy of right foot: Secondary | ICD-10-CM | POA: Diagnosis not present

## 2022-10-25 ENCOUNTER — Encounter: Payer: Self-pay | Admitting: Podiatry

## 2022-10-25 ENCOUNTER — Ambulatory Visit (INDEPENDENT_AMBULATORY_CARE_PROVIDER_SITE_OTHER): Payer: Commercial Managed Care - PPO | Admitting: Podiatry

## 2022-10-25 ENCOUNTER — Ambulatory Visit (INDEPENDENT_AMBULATORY_CARE_PROVIDER_SITE_OTHER): Payer: Commercial Managed Care - PPO

## 2022-10-25 DIAGNOSIS — M21619 Bunion of unspecified foot: Secondary | ICD-10-CM

## 2022-10-25 DIAGNOSIS — M21611 Bunion of right foot: Secondary | ICD-10-CM | POA: Diagnosis not present

## 2022-10-27 NOTE — Progress Notes (Signed)
Subjective:   Patient ID: Joy Horton, female   DOB: 62 y.o.   MRN: [id number]   HPI Patient states doing great with surgery very pleased   ROS      Objective:  Physical Exam  Neurovascular status intact excellent healing dorsal left foot from pin removal wound edges well coapted     Assessment:  Doing well post pin removal left first metatarsal     Plan:  Stitches removed wound edges coapted well advised this patient on bandage usage reappoint as needed  X-rays indicate satisfactory removal of pin distal pin in place stable

## 2022-11-08 ENCOUNTER — Ambulatory Visit (INDEPENDENT_AMBULATORY_CARE_PROVIDER_SITE_OTHER): Payer: Commercial Managed Care - PPO | Admitting: Podiatry

## 2022-11-08 ENCOUNTER — Ambulatory Visit (INDEPENDENT_AMBULATORY_CARE_PROVIDER_SITE_OTHER): Payer: Commercial Managed Care - PPO

## 2022-11-08 DIAGNOSIS — M21619 Bunion of unspecified foot: Secondary | ICD-10-CM | POA: Diagnosis not present

## 2022-11-08 DIAGNOSIS — M21611 Bunion of right foot: Secondary | ICD-10-CM

## 2022-11-10 NOTE — Progress Notes (Signed)
Subjective:   Patient ID: Joy Horton, female   DOB: 62 y.o.   MRN: [id number]   HPI Patient states she is doing very well with surgery very pleased   ROS      Objective:  Physical Exam  Neurovascular status intact negative [name]' sign noted wound edges are coapted well no drainage noted good range of motion good alignment good correction     Assessment:  Doing well post osteotomy right first metatarsal approximate 3 weeks duration     Plan:  H&P reviewed and at this point I have recommended gradual return to shoe gear in the next week or 2 continued immobilization part-time at least dispensed ankle compression stocking continue to recommend elevation as needed along with ice ibuprofen  X-rays indicate the osteotomy is healing very well joint congruence fixation in place

## 2022-11-13 ENCOUNTER — Encounter: Payer: Self-pay | Admitting: Internal Medicine

## 2022-11-23 MED ORDER — LEVOTHYROXINE SODIUM 88 MCG PO TABS: ORAL_TABLET | Freq: Every day | ORAL | 3 refills | Status: DC

## 2022-11-29 ENCOUNTER — Other Ambulatory Visit: Payer: Self-pay

## 2022-12-06 ENCOUNTER — Encounter: Payer: Self-pay | Admitting: Podiatry

## 2022-12-06 ENCOUNTER — Ambulatory Visit (INDEPENDENT_AMBULATORY_CARE_PROVIDER_SITE_OTHER): Payer: Commercial Managed Care - PPO

## 2022-12-06 ENCOUNTER — Ambulatory Visit (INDEPENDENT_AMBULATORY_CARE_PROVIDER_SITE_OTHER): Payer: Self-pay | Admitting: Podiatry

## 2022-12-06 DIAGNOSIS — M7751 Other enthesopathy of right foot: Secondary | ICD-10-CM

## 2022-12-06 DIAGNOSIS — M7752 Other enthesopathy of left foot: Secondary | ICD-10-CM

## 2022-12-06 DIAGNOSIS — M79676 Pain in unspecified toe(s): Secondary | ICD-10-CM

## 2022-12-06 DIAGNOSIS — Z9889 Other specified postprocedural states: Secondary | ICD-10-CM

## 2022-12-07 ENCOUNTER — Other Ambulatory Visit: Payer: Self-pay | Admitting: Podiatry

## 2022-12-07 DIAGNOSIS — M7751 Other enthesopathy of right foot: Secondary | ICD-10-CM

## 2022-12-07 DIAGNOSIS — Z9889 Other specified postprocedural states: Secondary | ICD-10-CM

## 2022-12-07 NOTE — Progress Notes (Signed)
Subjective:   Patient ID: Joy Horton, female   DOB: 62 y.o.   MRN: [id number]   HPI Patient states overall doing very well very pleased but would like to maintain motion as long as possible and is working on motion currently   ROS      Objective:  Physical Exam  Neurovascular status intact negative [name]' sign noted patient does have moderate inflammation on the first MPJ right history of hallux limitus condition with functional component     Assessment:  Hallux limitus deformity right with functional component noted with patient healing well from osteotomy surgery wound edges well coapted     Plan:  H&P reviewed and I went ahead today and I have recommended orthotics to try to encourage good healthy motion of the joint now that it is doing so much better postoperatively.  Patient is casted for functional orthotics reverse Morton's extension and I reviewed her x-rays continue range of motion exercises gradual return to soft shoe gear  X-rays indicate osteotomy is healing well fixation in place joint congruence no indication of crepitus

## 2022-12-31 ENCOUNTER — Telehealth: Payer: Self-pay | Admitting: Podiatry

## 2022-12-31 NOTE — Telephone Encounter (Signed)
Lmom for pt to call back to schedule picking up orthotics    Balance is 490.0

## 2023-01-06 ENCOUNTER — Ambulatory Visit: Payer: 59 | Admitting: Internal Medicine

## 2023-01-10 ENCOUNTER — Ambulatory Visit: Payer: Commercial Managed Care - PPO | Admitting: Internal Medicine

## 2023-01-10 ENCOUNTER — Other Ambulatory Visit: Payer: Self-pay

## 2023-01-10 ENCOUNTER — Encounter: Payer: Self-pay | Admitting: Internal Medicine

## 2023-01-10 VITALS — BP 124/88 | HR 71 | Temp 98.0°F | Ht 59.0 in | Wt 112.0 lb

## 2023-01-10 DIAGNOSIS — F419 Anxiety disorder, unspecified: Secondary | ICD-10-CM | POA: Diagnosis not present

## 2023-01-10 DIAGNOSIS — Z20822 Contact with and (suspected) exposure to covid-19: Secondary | ICD-10-CM

## 2023-01-10 DIAGNOSIS — E7800 Pure hypercholesterolemia, unspecified: Secondary | ICD-10-CM

## 2023-01-10 DIAGNOSIS — L900 Lichen sclerosus et atrophicus: Secondary | ICD-10-CM

## 2023-01-10 DIAGNOSIS — E039 Hypothyroidism, unspecified: Secondary | ICD-10-CM | POA: Diagnosis not present

## 2023-01-10 DIAGNOSIS — N941 Unspecified dyspareunia: Secondary | ICD-10-CM | POA: Diagnosis not present

## 2023-01-10 DIAGNOSIS — R7989 Other specified abnormal findings of blood chemistry: Secondary | ICD-10-CM

## 2023-01-10 DIAGNOSIS — Z1231 Encounter for screening mammogram for malignant neoplasm of breast: Secondary | ICD-10-CM

## 2023-01-10 DIAGNOSIS — R5383 Other fatigue: Secondary | ICD-10-CM | POA: Diagnosis not present

## 2023-01-10 DIAGNOSIS — Z0000 Encounter for general adult medical examination without abnormal findings: Secondary | ICD-10-CM | POA: Diagnosis not present

## 2023-01-10 MED ORDER — ESTRADIOL 0.1 MG/GM VA CREA
1.0000 | TOPICAL_CREAM | Freq: Every day | VAGINAL | 12 refills | Status: AC
Start: 2023-01-10 — End: ?
  Filled 2023-01-10: qty 42.5, 30d supply, fill #0
  Filled 2023-02-08: qty 42.5, 30d supply, fill #1
  Filled 2023-03-21: qty 42.5, 30d supply, fill #2
  Filled 2023-08-29 – 2023-09-09 (×2): qty 42.5, 30d supply, fill #3

## 2023-01-10 NOTE — Progress Notes (Unsigned)
Patient ID: Joy Horton, female    DOB: 1960-11-23  Age: 62 y.o. MRN: [id number]  The patient is here for annual preventive examination and management of other chronic and acute problems.   The risk factors are reflected in the social history.   The roster of all physicians providing medical care to patient - is listed in the Snapshot section of the chart.   Activities of daily living:  The patient is 100% independent in all ADLs: dressing, toileting, feeding as well as independent mobility   Home safety : The patient has smoke detectors in the home. They wear seatbelts.  There are no unsecured firearms at home. There is no violence in the home.    There is no risks for hepatitis, STDs or HIV. There is no   history of blood transfusion. They have no travel history to infectious disease endemic areas of the world.   The patient has seen their dentist in the last six month. They have seen their eye doctor in the last year. The patinet  denies hearing difficulty with regard to whispered voices and some television programs.  They have deferred audiologic testing in the last year.  They do not  have excessive sun exposure. Discussed the need for sun protection: hats, long sleeves and use of sunscreen if there is significant sun exposure.    Diet: the importance of a healthy diet is discussed. They do have a healthy diet.   The benefits of regular aerobic exercise were discussed. The patient  exercises  3 to 5 days per week  for  60 minutes.    Depression screen: there are no signs or vegative symptoms of depression- irritability, change in appetite, anhedonia, sadness/tearfullness.   The following portions of the patient's history were reviewed and updated as appropriate: allergies, current medications, past family history, past medical history,  past surgical history, past social history  and problem list.   Visual acuity was not assessed per patient preference since the patient has regular  follow up with an  ophthalmologist. Hearing and body mass index were assessed and reviewed.    During the course of the visit the patient was educated and counseled about appropriate screening and preventive services including : fall prevention , diabetes screening, nutrition counseling, colorectal cancer screening, and recommended immunizations.    Chief Complaint:  1) COVID EXPOSURE LAST FRIDAY.  Testing daily,  negative,  asymptomatic Gative  2) RECENT BUNION SURGERY right foot BY REGAL  done in MAY 14 WITH PINS PLACED.  Has felt off balance because of surgery . Sensation is returning in the foot; starting to feel the pins  3) unintentional weight loss;  post surgical anorexia ,  no taste for coffee either.  Eating salads and fruits  4) vaginal dryness with small fissure  ants to resume estrace cream 5)     Review of Symptoms  Patient denies headache, fevers, malaise, unintentional weight loss, skin rash, eye pain, sinus congestion and sinus pain, sore throat, dysphagia,  hemoptysis , cough, dyspnea, wheezing, chest pain, palpitations, orthopnea, edema, abdominal pain, nausea, melena, diarrhea, constipation, flank pain, dysuria, hematuria, urinary  Frequency, nocturia, numbness, tingling, seizures,  Focal weakness, Loss of consciousness,  Tremor, insomnia, depression, anxiety, and suicidal ideation.    Physical Exam:  BP 124/88   Pulse 71   Temp 98 F (36.7 C) (Oral)   Ht 4\' 11"  (1.499 m)   Wt 112 lb (50.8 kg)   SpO2 97%   BMI 22.62  kg/m    Physical Exam Vitals reviewed.  Constitutional:      General: She is not in acute distress.    Appearance: Normal appearance. She is well-developed and normal weight. She is not ill-appearing, toxic-appearing or diaphoretic.  HENT:     Head: Normocephalic.     Right Ear: Tympanic membrane, ear canal and external ear normal. There is no impacted cerumen.     Left Ear: Tympanic membrane, ear canal and external ear normal. There is no impacted  cerumen.     Nose: Nose normal.     Mouth/Throat:     Mouth: Mucous membranes are moist.     Pharynx: Oropharynx is clear.  Eyes:     General: No scleral icterus.       Right eye: No discharge.        Left eye: No discharge.     Conjunctiva/sclera: Conjunctivae normal.     Pupils: Pupils are equal, round, and reactive to light.  Neck:     Thyroid: No thyromegaly.     Vascular: No carotid bruit or JVD.  Cardiovascular:     Rate and Rhythm: Normal rate and regular rhythm.     Heart sounds: Normal heart sounds.  Pulmonary:     Effort: Pulmonary effort is normal. No respiratory distress.     Breath sounds: Normal breath sounds.  Chest:  Breasts:    Breasts are symmetrical.     Right: Normal. No swelling, inverted nipple, mass, nipple discharge, skin change or tenderness.     Left: Normal. No swelling, inverted nipple, mass, nipple discharge, skin change or tenderness.  Abdominal:     General: Bowel sounds are normal.     Palpations: Abdomen is soft. There is no mass.     Tenderness: There is no abdominal tenderness. There is no guarding or rebound.  Musculoskeletal:        General: Normal range of motion.     Cervical back: Normal range of motion and neck supple.  Lymphadenopathy:     Cervical: No cervical adenopathy.     Upper Body:     Right upper body: No supraclavicular, axillary or pectoral adenopathy.     Left upper body: No supraclavicular, axillary or pectoral adenopathy.  Skin:    General: Skin is warm and dry.  Neurological:     General: No focal deficit present.     Mental Status: She is alert and oriented to person, place, and time. Mental status is at baseline.  Psychiatric:        Mood and Affect: Mood normal.        Behavior: Behavior normal.        Thought Content: Thought content normal.        Judgment: Judgment normal.     Assessment and Plan: Encounter for screening mammogram for malignant neoplasm of breast    No follow-ups on file.  Sherlene Shams, MD

## 2023-01-10 NOTE — Assessment & Plan Note (Signed)
EXPOSED ON FRIDAY AUGUST 1 to a patient.   Daily testing has been negative.

## 2023-01-10 NOTE — Assessment & Plan Note (Signed)

## 2023-01-11 ENCOUNTER — Other Ambulatory Visit: Payer: Commercial Managed Care - PPO

## 2023-01-11 ENCOUNTER — Encounter: Payer: Self-pay | Admitting: Internal Medicine

## 2023-01-11 NOTE — Assessment & Plan Note (Addendum)
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

## 2023-01-11 NOTE — Assessment & Plan Note (Signed)
Currently quiescent

## 2023-01-11 NOTE — Assessment & Plan Note (Signed)
Job related,  Now improved with change in  Venues.

## 2023-01-12 NOTE — Addendum Note (Signed)
Addended by: Sherlene Shams on: 01/12/2023 11:23 AM   Modules accepted: Orders

## 2023-01-13 NOTE — Progress Notes (Signed)
Patient presents today to pick up custom molded foot orthotics, diagnosed with Bunion deformity by Dr. Charlsie Merles.   Orthotics were dispensed and fit was satisfactory. Reviewed instructions for break-in and wear. Written instructions given to patient.  Patient will follow up as needed.   Addison Bailey CPed, CFo, CFm

## 2023-02-02 ENCOUNTER — Telehealth: Payer: Self-pay | Admitting: Podiatry

## 2023-02-02 NOTE — Telephone Encounter (Signed)
Pt called requesting a referral to physical therapy. If needed to come to [facility] if needed, pt walking towards the right . She has gotten her orthotics and it has not gotten better.

## 2023-02-03 NOTE — Telephone Encounter (Signed)
Lvm for pt to call to schedule an appt with Dr Charlsie Merles so he can evaluate response to the orthotics per his request.

## 2023-02-03 NOTE — Telephone Encounter (Signed)
I would like to see her to evaluate response to orthotics

## 2023-02-09 DIAGNOSIS — Z780 Asymptomatic menopausal state: Secondary | ICD-10-CM | POA: Diagnosis not present

## 2023-02-09 DIAGNOSIS — Z01419 Encounter for gynecological examination (general) (routine) without abnormal findings: Secondary | ICD-10-CM | POA: Diagnosis not present

## 2023-02-09 DIAGNOSIS — L900 Lichen sclerosus et atrophicus: Secondary | ICD-10-CM | POA: Diagnosis not present

## 2023-02-09 DIAGNOSIS — E039 Hypothyroidism, unspecified: Secondary | ICD-10-CM | POA: Diagnosis not present

## 2023-02-09 DIAGNOSIS — N952 Postmenopausal atrophic vaginitis: Secondary | ICD-10-CM | POA: Diagnosis not present

## 2023-02-23 ENCOUNTER — Encounter: Payer: Self-pay | Admitting: Podiatry

## 2023-02-23 ENCOUNTER — Ambulatory Visit: Payer: Commercial Managed Care - PPO | Admitting: Podiatry

## 2023-02-23 DIAGNOSIS — M7751 Other enthesopathy of right foot: Secondary | ICD-10-CM | POA: Diagnosis not present

## 2023-02-23 NOTE — Progress Notes (Signed)
Subjective:   Patient ID: Joy Horton, female   DOB: 62 y.o.   MRN: [id number]   HPI Patient presents with orthotics she is not sure about very pleased with the big toe joint right   ROS      Objective:  Physical Exam  Neurovascular status intact negative [name]' sign noted excellent range of motion with inserts which seem to have created some left hip pain but she is not sure     Assessment:  Still recovering from first MPJ surgery right but excellent range of motion with possibility that orthotics are altering gait pattern     Plan:  8P discussed still has some healing to go of the underlying tissue but at this point we are going to slowly break in the orthotics and they may need to be adjusted and can see pedorthist if this were to be true

## 2023-03-29 ENCOUNTER — Other Ambulatory Visit (HOSPITAL_COMMUNITY): Payer: Self-pay

## 2023-04-05 ENCOUNTER — Other Ambulatory Visit: Payer: Self-pay

## 2023-04-14 ENCOUNTER — Other Ambulatory Visit (INDEPENDENT_AMBULATORY_CARE_PROVIDER_SITE_OTHER): Payer: Commercial Managed Care - PPO

## 2023-04-14 DIAGNOSIS — R7989 Other specified abnormal findings of blood chemistry: Secondary | ICD-10-CM

## 2023-04-14 LAB — CBC WITH DIFFERENTIAL/PLATELET
Basophils Absolute: 0.1 10*3/uL (ref 0.0–0.1)
Basophils Relative: 1.4 % (ref 0.0–3.0)
Eosinophils Absolute: 0.2 10*3/uL (ref 0.0–0.7)
Eosinophils Relative: 3.4 % (ref 0.0–5.0)
HCT: 38 % (ref 36.0–46.0)
Hemoglobin: 12.8 g/dL (ref 12.0–15.0)
Lymphocytes Relative: 29.8 % (ref 12.0–46.0)
Lymphs Abs: 1.5 10*3/uL (ref 0.7–4.0)
MCHC: 33.7 g/dL (ref 30.0–36.0)
MCV: 94.4 fL (ref 78.0–100.0)
Monocytes Absolute: 0.4 10*3/uL (ref 0.1–1.0)
Monocytes Relative: 7.9 % (ref 3.0–12.0)
Neutro Abs: 2.9 10*3/uL (ref 1.4–7.7)
Neutrophils Relative %: 57.5 % (ref 43.0–77.0)
Platelets: 327 10*3/uL (ref 150.0–400.0)
RBC: 4.03 Mil/uL (ref 3.87–5.11)
RDW: 12.8 % (ref 11.5–15.5)
WBC: 5 10*3/uL (ref 4.0–10.5)

## 2023-05-19 DIAGNOSIS — R21 Rash and other nonspecific skin eruption: Secondary | ICD-10-CM | POA: Diagnosis not present

## 2023-06-06 ENCOUNTER — Ambulatory Visit
Admission: RE | Admit: 2023-06-06 | Discharge: 2023-06-06 | Disposition: A | Discharge status: Home / Self Care | Payer: Commercial Managed Care - PPO | Source: Ambulatory Visit | Attending: Internal Medicine | Admitting: Internal Medicine

## 2023-06-06 DIAGNOSIS — Z1231 Encounter for screening mammogram for malignant neoplasm of breast: Secondary | ICD-10-CM | POA: Insufficient documentation

## 2023-07-13 ENCOUNTER — Ambulatory Visit: Payer: Commercial Managed Care - PPO | Admitting: Internal Medicine

## 2023-07-13 ENCOUNTER — Telehealth: Payer: Self-pay

## 2023-07-13 ENCOUNTER — Encounter: Payer: Self-pay | Admitting: Internal Medicine

## 2023-07-13 VITALS — BP 110/70 | HR 76 | Temp 97.5°F | Ht 59.0 in | Wt 116.2 lb

## 2023-07-13 DIAGNOSIS — M79674 Pain in right toe(s): Secondary | ICD-10-CM | POA: Diagnosis not present

## 2023-07-13 DIAGNOSIS — E782 Mixed hyperlipidemia: Secondary | ICD-10-CM

## 2023-07-13 DIAGNOSIS — E7800 Pure hypercholesterolemia, unspecified: Secondary | ICD-10-CM | POA: Diagnosis not present

## 2023-07-13 DIAGNOSIS — E039 Hypothyroidism, unspecified: Secondary | ICD-10-CM

## 2023-07-13 DIAGNOSIS — F419 Anxiety disorder, unspecified: Secondary | ICD-10-CM | POA: Diagnosis not present

## 2023-07-13 DIAGNOSIS — M47812 Spondylosis without myelopathy or radiculopathy, cervical region: Secondary | ICD-10-CM

## 2023-07-13 DIAGNOSIS — R92343 Mammographic extreme density, bilateral breasts: Secondary | ICD-10-CM

## 2023-07-13 DIAGNOSIS — M79676 Pain in unspecified toe(s): Secondary | ICD-10-CM | POA: Insufficient documentation

## 2023-07-13 LAB — TSH: TSH: 3.87 u[IU]/mL (ref 0.35–5.50)

## 2023-07-13 LAB — COMPREHENSIVE METABOLIC PANEL
ALT: 13 U/L (ref 0–35)
AST: 23 U/L (ref 0–37)
Albumin: 4.5 g/dL (ref 3.5–5.2)
Alkaline Phosphatase: 52 U/L (ref 39–117)
BUN: 13 mg/dL (ref 6–23)
CO2: 29 meq/L (ref 19–32)
Calcium: 9.4 mg/dL (ref 8.4–10.5)
Chloride: 103 meq/L (ref 96–112)
Creatinine, Ser: 0.67 mg/dL (ref 0.40–1.20)
GFR: 92.18 mL/min (ref >60.00)
Glucose, Bld: 86 mg/dL (ref 70–99)
Potassium: 3.9 meq/L (ref 3.5–5.1)
Sodium: 140 meq/L (ref 135–145)
Total Bilirubin: 0.6 mg/dL (ref 0.2–1.2)
Total Protein: 7.1 g/dL (ref 6.0–8.3)

## 2023-07-13 LAB — LIPID PANEL
Cholesterol: 250 mg/dL — ABNORMAL HIGH (ref 0–200)
HDL: 62.2 mg/dL (ref >39.00)
LDL Cholesterol: 165 mg/dL — ABNORMAL HIGH (ref 0–99)
NonHDL: 187.86
Total CHOL/HDL Ratio: 4
Triglycerides: 115 mg/dL (ref 0.0–149.0)
VLDL: 23 mg/dL (ref 0.0–40.0)

## 2023-07-13 LAB — LDL CHOLESTEROL, DIRECT: Direct LDL: 162 mg/dL

## 2023-07-13 MED ORDER — LEVOTHYROXINE SODIUM 88 MCG PO TABS: ORAL_TABLET | Freq: Every day | ORAL | 1 refills | Status: DC

## 2023-07-13 NOTE — Patient Instructions (Signed)
 Ditch the walk and use your [employer]  you'll get better toning  and won't stress the joint!

## 2023-07-13 NOTE — Progress Notes (Addendum)
 Subjective:  Patient ID: Joy Horton, female    DOB: [birth year]  Age: 63 y.o. MRN: [id number]  CC: The primary encounter diagnosis was Hypothyroidism, unspecified type. Diagnoses of Pure hypercholesterolemia, Anxiety, Cervical spondylosis without myelopathy, Pain of right great toe, and Hyperlipidemia, mixed were also pertinent to this visit.   HPI Joy Horton presents for  Chief Complaint  Patient presents with   Medical Management of Chronic Issues    6 month follow up     S/p surgery , distal  osteotomy right great toe  last May  still can't bend her big toe  ,  no sure why.  We reviewed her post operative films;  she was unaware that a pin was placed in the joint .   Still finds long walks  painful so has gained weight     Outpatient Medications Prior to Visit  Medication Sig Dispense Refill   estradiol  (ESTRACE ) 0.1 MG/GM vaginal cream Place 1 Applicatorful vaginally at bedtime. FOR 14 DAYS, then twice weekly thereafter 42.5 g 12   Multiple Vitamin (MULTIVITAMIN) tablet Take 1 tablet by mouth daily.     levothyroxine  (SYNTHROID ) 88 MCG tablet TAKE 1 TABLET BY MOUTH DAILY. 90 tablet 3   No facility-administered medications prior to visit.    Review of Systems;  Patient denies headache, fevers, malaise, unintentional weight loss, skin rash, eye pain, sinus congestion and sinus pain, sore throat, dysphagia,  hemoptysis , cough, dyspnea, wheezing, chest pain, palpitations, orthopnea, edema, abdominal pain, nausea, melena, diarrhea, constipation, flank pain, dysuria, hematuria, urinary  Frequency, nocturia, numbness, tingling, seizures,  Focal weakness, Loss of consciousness,  Tremor, insomnia, depression, anxiety, and suicidal ideation.      Objective:  BP 110/70   Pulse 76   Temp (!) 97.5 F (36.4 C) (Oral)   Ht 4' 11 (1.499 m)   Wt 116 lb 3.2 oz (52.7 kg)   SpO2 100%   BMI 23.47 kg/m   BP Readings from Last 3 Encounters:  07/13/23 110/70  01/10/23  124/88  01/04/22 100/64    Wt Readings from Last 3 Encounters:  07/13/23 116 lb 3.2 oz (52.7 kg)  01/10/23 112 lb (50.8 kg)  01/04/22 120 lb 12.8 oz (54.8 kg)    Physical Exam Vitals reviewed.  Constitutional:      General: She is not in acute distress.    Appearance: Normal appearance. She is normal weight. She is not ill-appearing, toxic-appearing or diaphoretic.  HENT:     Head: Normocephalic.  Eyes:     General: No scleral icterus.       Right eye: No discharge.        Left eye: No discharge.     Conjunctiva/sclera: Conjunctivae normal.  Cardiovascular:     Rate and Rhythm: Normal rate and regular rhythm.     Heart sounds: Normal heart sounds.  Pulmonary:     Effort: Pulmonary effort is normal. No respiratory distress.     Breath sounds: Normal breath sounds.  Musculoskeletal:        General: Normal range of motion.  Skin:    General: Skin is warm and dry.  Neurological:     General: No focal deficit present.     Mental Status: She is alert and oriented to person, place, and time. Mental status is at baseline.  Psychiatric:        Mood and Affect: Mood normal.        Behavior: Behavior normal.  Thought Content: Thought content normal.        Judgment: Judgment normal.    Lab Results  Component Value Date   HGBA1C 5.8 07/09/2021    Lab Results  Component Value Date   CREATININE 0.67 07/13/2023   CREATININE 0.63 01/10/2023   CREATININE 0.65 01/05/2022    Lab Results  Component Value Date   WBC 5.0 04/14/2023   HGB 12.8 04/14/2023   HCT 38.0 04/14/2023   PLT 327.0 04/14/2023   GLUCOSE 86 07/13/2023   CHOL 250 (H) 07/13/2023   TRIG 115.0 07/13/2023   HDL 62.20 07/13/2023   LDLDIRECT 162.0 07/13/2023   LDLCALC 165 (H) 07/13/2023   ALT 13 07/13/2023   AST 23 07/13/2023   NA 140 07/13/2023   K 3.9 07/13/2023   CL 103 07/13/2023   CREATININE 0.67 07/13/2023   BUN 13 07/13/2023   CO2 29 07/13/2023   TSH 3.87 07/13/2023   HGBA1C 5.8  07/09/2021    MM 3D SCREEN BREAST W/IMPLANT BILATERAL Result Date: 06/10/2023 CLINICAL DATA:  Screening. EXAM: DIGITAL SCREENING BILATERAL MAMMOGRAM WITH IMPLANTS, CAD AND TOMOSYNTHESIS TECHNIQUE: Bilateral screening digital craniocaudal and mediolateral oblique mammograms were obtained. Bilateral screening digital breast tomosynthesis was performed. The images were evaluated with computer-aided detection. Standard and/or implant displaced views were performed. COMPARISON:  Previous exam(s). ACR Breast Density Category d: The breasts are extremely dense, which lowers the sensitivity of mammography. FINDINGS: The patient has retropectoral implants. There are no findings suspicious for malignancy. IMPRESSION: No mammographic evidence of malignancy. A result letter of this screening mammogram will be mailed directly to the patient. RECOMMENDATION: Screening mammogram in one year. (Code:SM-B-01Y) BI-RADS CATEGORY  1: Negative. Electronically Signed   By: Alm Parkins M.D.   On: 06/10/2023 09:55    Assessment & Plan:  .Hypothyroidism, unspecified type Assessment & Plan: Thyroid  function is WNL on current 88 mcg dose.  No current changes needed.   Lab Results  Component Value Date   TSH 3.87 07/13/2023     Orders: -     TSH  Pure hypercholesterolemia -     Lipid panel -     LDL cholesterol, direct -     Comprehensive metabolic panel  Anxiety Assessment & Plan: Her anxitey has resolved since she left the ER and is working with a home health provider ,  she enjoys her work now   Cervical spondylosis without myelopathy Assessment & Plan:  Symptoms have resolve with  strengthening exercises  and change in work positioning    Pain of right great toe Assessment & Plan: Plain films reviewed,  it appears that her pain is secondary to iatrogenic immobility following the osteotomy and pinning in May .  Encouraged to find an alternative aerobic activity that does not involve weight bearing on  toe    Hyperlipidemia, mixed Assessment & Plan: 10 yr risk of CAD using the AHA risk calculator is 4%.  Patient has no incidental evidence of atherosclerosis .  No treatment advised at this time   Lab Results  Component Value Date   CHOL 250 (H) 07/13/2023   HDL 62.20 07/13/2023   LDLCALC 165 (H) 07/13/2023   LDLDIRECT 162.0 07/13/2023   TRIG 115.0 07/13/2023   CHOLHDL 4 07/13/2023      Other orders -     Levothyroxine  Sodium; TAKE 1 TABLET BY MOUTH DAILY.  Dispense: 90 tablet; Refill: 1     I provided 37 minutes of face-to-face time during this encounter reviewing patient's last  visit with me, patient's  most recent visit with podiatry ,  recent surgical and non surgical procedures, previous  labs and imaging studies, counseling on currently addressed issues,  and post visit ordering to diagnostics and therapeutics .   Follow-up: No follow-ups on file.   Joy Horton [place], MD

## 2023-07-13 NOTE — Assessment & Plan Note (Signed)
 Thyroid  function is WNL on current 88 mcg dose.  No current changes needed.   Lab Results  Component Value Date   TSH 3.87 07/13/2023

## 2023-07-13 NOTE — Assessment & Plan Note (Signed)
 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

## 2023-07-13 NOTE — Assessment & Plan Note (Signed)
 Symptoms have resolve with  strengthening exercises  and change in work positioning

## 2023-07-13 NOTE — Telephone Encounter (Signed)
 When discharging pt today she stated that she forgot to mention one thing. She stated that she received a letter from [facility] stating that she needs to speak to her provider about having very dense breast and see if she would like to order a breast US . I advised pt that I would send you a message and we would get back with her.

## 2023-07-13 NOTE — Assessment & Plan Note (Signed)
 Plain films reviewed,  it appears that her pain is secondary to iatrogenic immobility following the osteotomy and pinning in May .  Encouraged to find an alternative aerobic activity that does not involve weight bearing on toe

## 2023-07-14 ENCOUNTER — Encounter: Payer: Self-pay | Admitting: Internal Medicine

## 2023-07-14 DIAGNOSIS — E782 Mixed hyperlipidemia: Secondary | ICD-10-CM | POA: Insufficient documentation

## 2023-07-14 NOTE — Assessment & Plan Note (Signed)
 10 yr risk of CAD using the AHA risk calculator is 4%.  Patient has no incidental evidence of atherosclerosis .  No treatment advised at this time   Lab Results  Component Value Date   CHOL 250 (H) 07/13/2023   HDL 62.20 07/13/2023   LDLCALC 165 (H) 07/13/2023   LDLDIRECT 162.0 07/13/2023   TRIG 115.0 07/13/2023   CHOLHDL 4 07/13/2023

## 2023-07-18 NOTE — Telephone Encounter (Signed)
 Pt is aware.

## 2023-07-18 NOTE — Addendum Note (Signed)
 Addended by: Thersia Flax on: 07/18/2023 02:00 PM   Modules accepted: Orders

## 2023-07-18 NOTE — Telephone Encounter (Signed)
 Spoke with pt and she stated that she would like for you to go ahead and order the breast US . Once ordered she will call to find out if it is going to be covered by insurance.

## 2023-08-23 ENCOUNTER — Other Ambulatory Visit: Payer: Self-pay

## 2023-09-08 ENCOUNTER — Other Ambulatory Visit: Payer: Self-pay

## 2023-09-09 ENCOUNTER — Other Ambulatory Visit: Payer: Self-pay

## 2023-11-03 ENCOUNTER — Encounter: Payer: Self-pay | Admitting: Podiatry

## 2023-11-03 ENCOUNTER — Ambulatory Visit (INDEPENDENT_AMBULATORY_CARE_PROVIDER_SITE_OTHER)

## 2023-11-03 ENCOUNTER — Ambulatory Visit: Admitting: Podiatry

## 2023-11-03 DIAGNOSIS — M7751 Other enthesopathy of right foot: Secondary | ICD-10-CM

## 2023-11-03 DIAGNOSIS — T148XXA Other injury of unspecified body region, initial encounter: Secondary | ICD-10-CM

## 2023-11-03 MED ORDER — TRIAMCINOLONE ACETONIDE 10 MG/ML IJ SUSP
10.0000 mg | Freq: Once | INTRAMUSCULAR | Status: AC
Start: 2023-11-03 — End: 2023-11-03
  Administered 2023-11-03: 10 mg via INTRA_ARTICULAR

## 2023-11-04 NOTE — Progress Notes (Signed)
 Subjective:   Patient ID: Joy Horton, female   DOB: 63 y.o.   MRN: [id number]   HPI Patient states she is having quite a bit of pain in the bottom of his right foot and that she feels like it has been there for around 3 months.  She has tried some gel and she has custom orthotics which did not seem to benefit this problem   ROS      Objective:  Physical Exam  Neurovascular status intact inflammation fluid around the second MPJ right that is moderately painful the first MPJ functioning excellent great range of motion     Assessment:  Inflammatory capsulitis possible second MPJ right or there may be some structural changes from bunion deformity painful     Plan:  H&P x-ray reviewed sterile prep did a block of the area 60 mg like Marcaine mixture aspirated the second MPJ getting out a small amount of clear fluid injected quarter cc dexamethasone Kenalog and applied dressing.  Dispensed metatarsal padding rigid bottom shoes and I want to see orthotics when she comes back  X-rays indicate that the joint itself looks great from the previous surgery and I did not note significant elongation pattern of the second MPJ

## 2023-11-24 ENCOUNTER — Ambulatory Visit: Admitting: Podiatry

## 2023-11-24 ENCOUNTER — Encounter: Payer: Self-pay | Admitting: Podiatry

## 2023-11-24 DIAGNOSIS — M2041 Other hammer toe(s) (acquired), right foot: Secondary | ICD-10-CM

## 2023-11-24 DIAGNOSIS — M7751 Other enthesopathy of right foot: Secondary | ICD-10-CM

## 2023-11-24 NOTE — Progress Notes (Signed)
 Subjective:   Patient ID: Joy Horton, female   DOB: 63 y.o.   MRN: [id number]   HPI Patient presents stating that she has a lot of pain in the second joint of her right foot and and states that that has improved a lot with how we treated it a couple weeks ago but the toe is still weak as it has been for several months and she did feel some kind of snap in that area around 3 months ago.  States that she is satisfied right now where the inflammation is but still concerned about the toe   ROS      Objective:  Physical Exam  Neurovascular status intact good digital perfusion it does appear the extensor tendon is not functioning properly to the second toe right even though I do think it is intact but difficult to make complete determination.  The toe overall is not in an unreasonable position and it is not really bothering her but she is aware that in the second MPJ has improved     Assessment:  Inflammatory capsulitis second MPJ which seems to be improving with moderate plantarflexion of the second toe which can become irritative     Plan:  8P reviewed we may eventually get an MRI but at this point I do not see anything really to correct and I want to see how she responds to increasing her activity levels.  Patient had the orthotics return ongoing to try to modify the right one with increased cushioning to see if we can take some of the pressure off the foot.  Reappoint 6 weeks

## 2023-12-12 ENCOUNTER — Telehealth: Payer: Self-pay

## 2023-12-12 NOTE — Telephone Encounter (Signed)
 Tried calling pt. Number continued to ring and stated call couldn't be completed. Need to know if pt is having symptoms. Okay to relay

## 2023-12-12 NOTE — Telephone Encounter (Signed)
 Copied from CRM [id number]. Topic: Appointments - Scheduling Inquiry for Clinic >> Dec 12, 2023  9:42 AM Chasity T wrote: Reason for CRM: Patient is calling in because she is having concerns regarding her thyroids and wants to see Dr Marylynn to get labs as well. Advised her that her next appointment was Aug 20th and lab appointments can't be scheduled until orders are put in. She would like a call back to discuss if she can get her labs done this month.

## 2023-12-13 ENCOUNTER — Telehealth: Payer: Self-pay

## 2023-12-13 NOTE — Telephone Encounter (Signed)
 Error

## 2023-12-15 ENCOUNTER — Telehealth: Payer: Self-pay | Admitting: Internal Medicine

## 2023-12-15 ENCOUNTER — Other Ambulatory Visit: Payer: Self-pay | Admitting: Internal Medicine

## 2023-12-15 DIAGNOSIS — E782 Mixed hyperlipidemia: Secondary | ICD-10-CM

## 2023-12-15 DIAGNOSIS — E039 Hypothyroidism, unspecified: Secondary | ICD-10-CM

## 2023-12-15 DIAGNOSIS — E559 Vitamin D deficiency, unspecified: Secondary | ICD-10-CM

## 2023-12-15 NOTE — Telephone Encounter (Signed)
 Lab order needed

## 2023-12-15 NOTE — Telephone Encounter (Signed)
 Tried calling pt. Number continued to ring and stated call couldn't be completed

## 2023-12-15 NOTE — Addendum Note (Signed)
 Addended by: MARYLYNN VERNEITA CROME on: 12/15/2023 08:49 PM   Modules accepted: Orders

## 2023-12-16 ENCOUNTER — Telehealth: Payer: Self-pay | Admitting: Podiatry

## 2023-12-16 NOTE — Telephone Encounter (Signed)
 Labs have been ordered and pt has a lab appointment scheduled.

## 2023-12-16 NOTE — Telephone Encounter (Signed)
 Patient called asking if we could refer her out for a MRI of her Right foot, and if we can if she could be referred out to a facility in [place] as that is closer to home for her. Please advise, thanks!

## 2023-12-22 ENCOUNTER — Other Ambulatory Visit: Payer: Self-pay

## 2023-12-22 ENCOUNTER — Other Ambulatory Visit: Payer: Self-pay | Admitting: Podiatry

## 2023-12-22 DIAGNOSIS — M7751 Other enthesopathy of right foot: Secondary | ICD-10-CM

## 2023-12-22 NOTE — Telephone Encounter (Signed)
 Ordered mri

## 2023-12-27 ENCOUNTER — Other Ambulatory Visit (INDEPENDENT_AMBULATORY_CARE_PROVIDER_SITE_OTHER)

## 2023-12-27 DIAGNOSIS — E559 Vitamin D deficiency, unspecified: Secondary | ICD-10-CM | POA: Diagnosis not present

## 2023-12-27 DIAGNOSIS — E782 Mixed hyperlipidemia: Secondary | ICD-10-CM

## 2023-12-27 DIAGNOSIS — E039 Hypothyroidism, unspecified: Secondary | ICD-10-CM

## 2023-12-27 LAB — LIPID PANEL W/REFLEX DIRECT LDL
Cholesterol: 255 mg/dL — ABNORMAL HIGH (ref <200)
HDL: 64 mg/dL (ref >=50)
LDL Cholesterol (Calc): 160 mg/dL — ABNORMAL HIGH
Non-HDL Cholesterol (Calc): 191 mg/dL — ABNORMAL HIGH (ref <130)
Total CHOL/HDL Ratio: 4 (calc) (ref <5.0)
Triglycerides: 159 mg/dL — ABNORMAL HIGH (ref <150)

## 2023-12-27 LAB — COMPREHENSIVE METABOLIC PANEL WITH GFR
ALT: 13 U/L (ref 0–35)
AST: 20 U/L (ref 0–37)
Albumin: 4.5 g/dL (ref 3.5–5.2)
Alkaline Phosphatase: 57 U/L (ref 39–117)
BUN: 17 mg/dL (ref 6–23)
CO2: 31 meq/L (ref 19–32)
Calcium: 9.4 mg/dL (ref 8.4–10.5)
Chloride: 103 meq/L (ref 96–112)
Creatinine, Ser: 0.63 mg/dL (ref 0.40–1.20)
GFR: 93.25 mL/min (ref >60.00)
Glucose, Bld: 86 mg/dL (ref 70–99)
Potassium: 4 meq/L (ref 3.5–5.1)
Sodium: 141 meq/L (ref 135–145)
Total Bilirubin: 0.5 mg/dL (ref 0.2–1.2)
Total Protein: 6.7 g/dL (ref 6.0–8.3)

## 2023-12-27 LAB — TSH: TSH: 1.26 u[IU]/mL (ref 0.35–5.50)

## 2023-12-27 LAB — VITAMIN D 25 HYDROXY (VIT D DEFICIENCY, FRACTURES): VITD: 30.89 ng/mL (ref 30.00–100.00)

## 2023-12-29 ENCOUNTER — Ambulatory Visit: Payer: Self-pay | Admitting: Internal Medicine

## 2023-12-29 ENCOUNTER — Ambulatory Visit: Admitting: Podiatry

## 2023-12-30 ENCOUNTER — Ambulatory Visit
Admission: RE | Admit: 2023-12-30 | Discharge: 2023-12-30 | Disposition: A | Discharge status: Home / Self Care | Source: Ambulatory Visit | Attending: Podiatry | Admitting: Podiatry

## 2023-12-30 DIAGNOSIS — M7751 Other enthesopathy of right foot: Secondary | ICD-10-CM | POA: Diagnosis not present

## 2023-12-30 DIAGNOSIS — M79671 Pain in right foot: Secondary | ICD-10-CM | POA: Diagnosis not present

## 2023-12-30 DIAGNOSIS — M19071 Primary osteoarthritis, right ankle and foot: Secondary | ICD-10-CM | POA: Diagnosis not present

## 2024-01-03 ENCOUNTER — Telehealth: Payer: Self-pay | Admitting: Podiatry

## 2024-01-03 NOTE — Telephone Encounter (Signed)
 Patient recently had MRI, and would like to discuss having a CT scan on right foot. Please contact patient [phone]

## 2024-01-04 ENCOUNTER — Encounter: Payer: Self-pay | Admitting: Internal Medicine

## 2024-01-04 ENCOUNTER — Telehealth: Admitting: Internal Medicine

## 2024-01-04 VITALS — Ht 59.0 in | Wt 115.0 lb

## 2024-01-04 DIAGNOSIS — M79671 Pain in right foot: Secondary | ICD-10-CM | POA: Diagnosis not present

## 2024-01-04 DIAGNOSIS — M79674 Pain in right toe(s): Secondary | ICD-10-CM | POA: Diagnosis not present

## 2024-01-04 DIAGNOSIS — M205X1 Other deformities of toe(s) (acquired), right foot: Secondary | ICD-10-CM | POA: Diagnosis not present

## 2024-01-04 DIAGNOSIS — E782 Mixed hyperlipidemia: Secondary | ICD-10-CM | POA: Diagnosis not present

## 2024-01-04 DIAGNOSIS — M216X1 Other acquired deformities of right foot: Secondary | ICD-10-CM | POA: Diagnosis not present

## 2024-01-04 DIAGNOSIS — E039 Hypothyroidism, unspecified: Secondary | ICD-10-CM | POA: Diagnosis not present

## 2024-01-04 DIAGNOSIS — M792 Neuralgia and neuritis, unspecified: Secondary | ICD-10-CM | POA: Diagnosis not present

## 2024-01-04 DIAGNOSIS — T8484XA Pain due to internal orthopedic prosthetic devices, implants and grafts, initial encounter: Secondary | ICD-10-CM | POA: Diagnosis not present

## 2024-01-04 DIAGNOSIS — Z124 Encounter for screening for malignant neoplasm of cervix: Secondary | ICD-10-CM

## 2024-01-04 MED ORDER — LEVOTHYROXINE SODIUM 88 MCG PO TABS: ORAL_TABLET | Freq: Every day | ORAL | 1 refills | Status: DC

## 2024-01-04 NOTE — Assessment & Plan Note (Signed)
 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  Presumed to be due to interposition of wire in the joint space. Hardware to be removed in mid August

## 2024-01-04 NOTE — Assessment & Plan Note (Signed)
 PAP smear was normal in 2023 (Dr Rutherford)

## 2024-01-04 NOTE — Assessment & Plan Note (Signed)
 Based on current lipid profile, the risk of clinically significant CAD is  5  over the next 10 years, using the  AHA risk calculator.  No treatment advised  Lab Results  Component Value Date   CHOL 255 (H) 12/27/2023   HDL 64 12/27/2023   LDLCALC 160 (H) 12/27/2023   LDLDIRECT 162.0 07/13/2023   TRIG 159 (H) 12/27/2023   CHOLHDL 4.0 12/27/2023

## 2024-01-04 NOTE — Assessment & Plan Note (Addendum)
 TSH is therapeutic on 88 mcg levothyroxine  daily   Continue current levothyoxine dose, refilled for 90 days    Recheck in 6 montths    Lab Results  Component Value Date   TSH 1.26 12/27/2023

## 2024-01-04 NOTE — Progress Notes (Signed)
 Virtual Visit via Caregility   Note   This format is felt to be most appropriate for this patient at this time.  All issues noted in this document were discussed and addressed.  No physical exam was performed (except for noted visual exam findings with Video Visits).   I connected with Joy Horton  on 01/04/24 at  5:00 PM EDT by a video enabled telemedicine application  and verified that I am speaking with the correct person using two identifiers. Location patient: home Location provider: work or home office Persons participating in the virtual visit: patient, provider  I discussed the limitations, risks, security and privacy concerns of performing an evaluation and management service by telephone and the availability of in person appointments. I also discussed with the patient that there may be a patient responsible charge related to this service. The patient expressed understanding and agreed to proceed.  Reason for visit: follow up   HPI:  1) Joy Horton has had persistent right foot pain for the past year .    She underwent a bunionectomy /pinning  by Dr. Magdalen in  May 2024,  but has had persistent pain aggravated by use of prescribed orthotics. 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  She recently obtained a second opinion form [facility] podiatrist  Dr Lennie who advised her to have the hardware removed I, and this is scheduled for mid August.   2) Hypothyroidism:  she is taking levothyroxine  and recent TSH was reviewed today. She has no symptoms except heat intolerance.   3)    ROS: See pertinent positives and negatives per HPI.  Past Medical History:  Diagnosis Date   GERD (gastroesophageal reflux disease)    infrequent   hypothyroid    Puncture  wound of buttock 08/14/2020   Tick fever 12/23/2015    Past Surgical History:  Procedure Laterality Date   AUGMENTATION MAMMAPLASTY Bilateral 1995   METATARSAL OSTEOTOMY WITH BUNIONECTOMY Right 10/2022    Family History  Problem Relation Age of Onset   Diabetes Mother    Arthritis Mother     SOCIAL HX: reports that she has never smoked. She has never used smokeless tobacco. She reports current alcohol use of about 5.0 standard drinks of alcohol per week. She reports that she does not use drugs.    Current Outpatient Medications:    estradiol  (ESTRACE ) 0.1 MG/GM vaginal cream, Place 1 Applicatorful vaginally at bedtime. FOR 14 DAYS, then twice weekly thereafter, Disp: 42.5 g, Rfl: 12   Multiple Vitamin (MULTIVITAMIN) tablet, Take 1 tablet by mouth daily., Disp: , Rfl:    levothyroxine  (SYNTHROID ) 88 MCG tablet, TAKE 1 TABLET BY MOUTH DAILY., Disp: 90 tablet, Rfl: 1  EXAM:  VITALS per patient if applicable:  GENERAL: alert, oriented, appears well and in no acute distress  HEENT: atraumatic, conjunttiva clear, no obvious abnormalities on inspection of external nose and ears  NECK: normal movements of the head and neck  LUNGS: on inspection no signs of respiratory distress, breathing rate appears normal, no obvious gross SOB, gasping or wheezing  CV: no obvious cyanosis  MS: moves all visible extremities without noticeable abnormality  PSYCH/NEURO: pleasant and cooperative, no obvious depression or anxiety, speech and thought processing grossly intact  ASSESSMENT AND PLAN: Screening  for cervical cancer Assessment & Plan: PAP smear was normal in 2023 (Dr Rutherford)   Hypothyroidism, unspecified type Assessment & Plan: TSH is therapeutic on 88 mcg levothyroxine  daily   Continue current levothyoxine dose, refilled for 90 days    Recheck in 6 montths    Lab Results  Component Value Date   TSH 1.26 12/27/2023      Hyperlipidemia, mixed Assessment & Plan: Based on  current lipid profile, the risk of clinically significant CAD is  5  over the next 10 years, using the  AHA risk calculator.  No treatment advised  Lab Results  Component Value Date   CHOL 255 (H) 12/27/2023   HDL 64 12/27/2023   LDLCALC 160 (H) 12/27/2023   LDLDIRECT 162.0 07/13/2023   TRIG 159 (H) 12/27/2023   CHOLHDL 4.0 12/27/2023      Pain of right great toe Assessment & Plan: Chronic postoperative pain pst bunionectomy with pinning.  Presumed to be due to interposition of wire in the joint space. Hardware to be removed in mid August    Other orders -     Levothyroxine  Sodium; TAKE 1 TABLET BY MOUTH DAILY.  Dispense: 90 tablet; Refill: 1      I discussed the assessment and treatment plan with the patient. The patient was provided an opportunity to ask questions and all were answered. The patient agreed with the plan and demonstrated an understanding of the instructions.   The patient was advised to call back or seek an in-person evaluation if the symptoms worsen or if the condition fails to improve as anticipated.   I spent 30 minutes dedicated to the care of this patient on the date of this encounter to include pre-visit review of patient's medical history,  including prior surgery,  follow up visits with podiatry and gynecology,  recent MRI right foot, recent  labs, face-to-face time with the patient , and post visit ordering of testing and therapeutics.    Verneita LITTIE Kettering, MD

## 2024-01-10 ENCOUNTER — Telehealth: Payer: Self-pay

## 2024-01-10 NOTE — Telephone Encounter (Signed)
 Pt lvm- checking on status of orthotics (???)

## 2024-01-18 ENCOUNTER — Ambulatory Visit: Admitting: Podiatry

## 2024-01-26 DIAGNOSIS — D1801 Hemangioma of skin and subcutaneous tissue: Secondary | ICD-10-CM | POA: Diagnosis not present

## 2024-01-26 DIAGNOSIS — R208 Other disturbances of skin sensation: Secondary | ICD-10-CM | POA: Diagnosis not present

## 2024-01-26 DIAGNOSIS — D485 Neoplasm of uncertain behavior of skin: Secondary | ICD-10-CM | POA: Diagnosis not present

## 2024-02-23 DIAGNOSIS — L900 Lichen sclerosus et atrophicus: Secondary | ICD-10-CM | POA: Diagnosis not present

## 2024-02-23 DIAGNOSIS — N952 Postmenopausal atrophic vaginitis: Secondary | ICD-10-CM | POA: Diagnosis not present

## 2024-02-23 DIAGNOSIS — Z780 Asymptomatic menopausal state: Secondary | ICD-10-CM | POA: Diagnosis not present

## 2024-02-23 DIAGNOSIS — E039 Hypothyroidism, unspecified: Secondary | ICD-10-CM | POA: Diagnosis not present

## 2024-02-24 DIAGNOSIS — M216X1 Other acquired deformities of right foot: Secondary | ICD-10-CM | POA: Diagnosis not present

## 2024-02-24 DIAGNOSIS — T8484XA Pain due to internal orthopedic prosthetic devices, implants and grafts, initial encounter: Secondary | ICD-10-CM | POA: Diagnosis not present

## 2024-02-24 DIAGNOSIS — M205X1 Other deformities of toe(s) (acquired), right foot: Secondary | ICD-10-CM | POA: Diagnosis not present

## 2024-02-24 DIAGNOSIS — M79671 Pain in right foot: Secondary | ICD-10-CM | POA: Diagnosis not present

## 2024-02-24 DIAGNOSIS — M792 Neuralgia and neuritis, unspecified: Secondary | ICD-10-CM | POA: Diagnosis not present

## 2024-04-05 ENCOUNTER — Other Ambulatory Visit: Payer: Self-pay

## 2024-04-05 MED ORDER — FLUZONE 0.5 ML IM SUSY
0.5000 mL | PREFILLED_SYRINGE | Freq: Once | INTRAMUSCULAR | 0 refills | Status: AC
  Filled 2024-04-05: qty 0.5, 1d supply, fill #0

## 2024-04-12 DIAGNOSIS — L821 Other seborrheic keratosis: Secondary | ICD-10-CM | POA: Diagnosis not present

## 2024-04-12 DIAGNOSIS — Z85828 Personal history of other malignant neoplasm of skin: Secondary | ICD-10-CM | POA: Diagnosis not present

## 2024-04-12 DIAGNOSIS — D1801 Hemangioma of skin and subcutaneous tissue: Secondary | ICD-10-CM | POA: Diagnosis not present

## 2024-04-12 DIAGNOSIS — Z08 Encounter for follow-up examination after completed treatment for malignant neoplasm: Secondary | ICD-10-CM | POA: Diagnosis not present

## 2024-05-09 ENCOUNTER — Emergency Department: Admission: EM | Admit: 2024-05-09 | Discharge: 2024-05-09 | Disposition: A | Discharge status: Home / Self Care

## 2024-05-09 ENCOUNTER — Emergency Department

## 2024-05-09 ENCOUNTER — Other Ambulatory Visit: Payer: Self-pay

## 2024-05-09 DIAGNOSIS — S0990XA Unspecified injury of head, initial encounter: Secondary | ICD-10-CM | POA: Insufficient documentation

## 2024-05-09 DIAGNOSIS — E039 Hypothyroidism, unspecified: Secondary | ICD-10-CM | POA: Diagnosis not present

## 2024-05-09 DIAGNOSIS — W19XXXA Unspecified fall, initial encounter: Secondary | ICD-10-CM

## 2024-05-09 DIAGNOSIS — W009XXA Unspecified fall due to ice and snow, initial encounter: Secondary | ICD-10-CM | POA: Insufficient documentation

## 2024-05-09 NOTE — Discharge Instructions (Signed)
 You were evaluated in the ED for a headache and/or head injury. Your evaluation did not reveal signs of a serious condition such as a brain bleed or skull fracture and were reassuring.  Your CT head was normal.  Consider these rest and recovery strategies at home:  - Avoid strenous activity , exercise or heavy lifting for a few days.  -Limit screen time and activities requiring intense focus.  - Gradually return to normal activities as symptoms improve   Take tylenol  as needed: Acetaminophen  (tylenol ) - You can take 2 extra strength tablets (1000 mg) every 6 hours as needed for pain/fever.  You can alternate these medications or take them together.  Make sure you eat food/drink water when taking these medications.  Apply ice/cold pack to the affected area 10-20 minutes every 2-3 hours for the first 24-48 hours to reduce pain and swelling. Avoid alcohol and caffeine.   Follow up with your PCP 1-2 weeks if symptoms persist or worsen.

## 2024-05-09 NOTE — ED Triage Notes (Signed)
 Pt comes with fall while at pt home. 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 Pt states she fell back and hit her head. Pt denies any loc. Pt feet slipped and she fell.

## 2024-05-09 NOTE — ED Provider Notes (Signed)
 [facility] Emergency Department Provider Note     Event Date/Time   First MD Initiated Contact with Patient 05/09/24 1548     (approximate)   History   Head Injury   HPI  Joy Horton is a 63 y.o. female with a past medical history of GERD and hypothyroidism presents to the ED for evaluation of head injury.  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  She denies LOC, however reports feeling immediate nausea and dizziness that has subsided since the incident.  No vision changes, however endorses light sensitivity.  No anticoagulation use.     Physical Exam   Triage Vital Signs: ED Triage Vitals  Encounter Vitals Group     BP 05/09/24 1510 (!) 145/99     Girls Systolic BP Percentile --      Girls Diastolic BP Percentile --      Boys Systolic BP Percentile --      Boys Diastolic BP Percentile --      Pulse Rate 05/09/24 1510 90     Resp 05/09/24 1510 15     Temp 05/09/24 1510 97.7 F (36.5 C)     Temp Source 05/09/24 1510 Oral     SpO2 05/09/24 1510 97 %     Weight --      Height --      Head Circumference --      Peak Flow --      Pain Score 05/09/24 1514 2     Pain Loc --      Pain Education --      Exclude from Growth Chart --     Most recent vital signs: Vitals:   05/09/24 1510  BP: (!) 145/99  Pulse: 90  Resp: 15  Temp: 97.7 F (36.5 C)  SpO2: 97%   General: Well appearing and comfortable. Alert and oriented. INAD.  Head:  NCAT.  Small hematoma palpated over crown of scalp. Eyes:  PERRLA. EOMI.  Neck:   No cervical spine tenderness to palpation. Full ROM without difficulty.  CV:  Good peripheral perfusion.  RESP:  Normal effort. BACK:  Spinous process is midline without deformity or tenderness. MSK:   Full ROM in all joints. No swelling, deformity or tenderness.  NEURO: Cranial nerves II-XII intact. No focal deficits.  Speech is clear. Sensation and motor function intact. Normal muscle strength of UE & LE. Gait is steady.     ED Results / Procedures / Treatments   Labs (all labs ordered are listed, but only abnormal results are displayed) Labs Reviewed - No data to display  RADIOLOGY  I personally viewed and evaluated these images as part of my medical decision making, as well as reviewing the written report by the radiologist.  ED Provider Interpretation: Normal-appearing head CT.  No noted ICH  CT Head Wo Contrast Result Date: 05/09/2024 EXAM: CT HEAD WITHOUT CONTRAST 05/09/2024 04:26:30 PM TECHNIQUE: CT of the head was performed without the administration of intravenous contrast. Automated exposure control, iterative reconstruction, and/or weight based adjustment of the mA/kV was utilized to reduce the radiation dose to as low as reasonably achievable. COMPARISON: Head MRI 05/26/2010. CLINICAL HISTORY: Fall. FINDINGS: BRAIN AND VENTRICLES: There is no evidence of an acute infarct, intracranial hemorrhage, mass, midline shift, hydrocephalus, or extra-axial fluid collection. Cerebral volume is normal. ORBITS: No acute abnormality. SINUSES: Partially visualized  mild mucosal thickening or secretions in the left maxillary sinus. Clear mastoid air cells. SOFT TISSUES AND SKULL: No acute soft tissue abnormality. No skull fracture. IMPRESSION: 1. No acute intracranial abnormality. Electronically signed by: Dasie Hamburg MD 05/09/2024 04:33 PM EST RP Workstation: [id number]    PROCEDURES:  Critical Care performed: No  Procedures   MEDICATIONS ORDERED IN ED: Medications - No data to display   IMPRESSION / MDM / ASSESSMENT AND PLAN / ED COURSE  I reviewed the triage vital signs and the nursing notes.                              Clinical Course as of 05/09/24 1642  Wed May 09, 2024  1635 CT Head Wo Contrast IMPRESSION: 1. No acute intracranial abnormality.   [MH]    Clinical Course User Index [MH]  Margrette Monte A, PA-C    63 y.o. female presents to the emergency department for evaluation and treatment of fall sustaining head injury. See HPI for further details.   Differential diagnosis includes, but is not limited to ICH, fracture, contusion, hematoma  Patient's presentation is most consistent with acute complicated illness / injury requiring diagnostic workup.  Patient is alert and oriented.  She is hemodynamically stable.  Physical exam findings are stated above.  Normal neuroexam.  No red flag signs.  CT head obtained in triage which is reassuring.  No acute intracranial abnormality.  Patient stable condition for discharge home.  Head concussion and injury protocols instructions provided at discharge.  Advised to follow-up with primary care provider as needed.  ED return precaution discussed. The patient is in stable and satisfactory condition for discharge home.   FINAL CLINICAL IMPRESSION(S) / ED DIAGNOSES   Final diagnoses:  Injury of head, initial encounter  Fall, initial encounter   Rx / DC Orders   ED Discharge Orders     None      Note:  This document was prepared using Dragon voice recognition software and may include unintentional dictation errors.    Margrette Monte A, PA-C 05/09/24 1644    Clarine Ozell LABOR, MD 05/09/24 [phone]

## 2024-05-15 ENCOUNTER — Other Ambulatory Visit: Payer: Self-pay | Admitting: Internal Medicine

## 2024-05-15 DIAGNOSIS — Z1231 Encounter for screening mammogram for malignant neoplasm of breast: Secondary | ICD-10-CM

## 2024-06-03 ENCOUNTER — Other Ambulatory Visit: Payer: Self-pay | Admitting: Internal Medicine

## 2024-06-06 ENCOUNTER — Ambulatory Visit
Admission: RE | Admit: 2024-06-06 | Discharge: 2024-06-06 | Disposition: A | Discharge status: Home / Self Care | Source: Ambulatory Visit | Attending: Internal Medicine | Admitting: Internal Medicine

## 2024-06-06 DIAGNOSIS — Z1231 Encounter for screening mammogram for malignant neoplasm of breast: Secondary | ICD-10-CM | POA: Diagnosis present

## 2024-06-14 ENCOUNTER — Ambulatory Visit (HOSPITAL_BASED_OUTPATIENT_CLINIC_OR_DEPARTMENT_OTHER)
Admission: RE | Admit: 2024-06-14 | Discharge: 2024-06-14 | Disposition: A | Discharge status: Home / Self Care | Source: Ambulatory Visit | Attending: Family Medicine

## 2024-06-14 ENCOUNTER — Encounter (HOSPITAL_BASED_OUTPATIENT_CLINIC_OR_DEPARTMENT_OTHER): Payer: Self-pay

## 2024-06-14 ENCOUNTER — Other Ambulatory Visit (HOSPITAL_BASED_OUTPATIENT_CLINIC_OR_DEPARTMENT_OTHER): Payer: Self-pay

## 2024-06-14 VITALS — BP 124/84 | HR 86 | Temp 98.4°F | Resp 20

## 2024-06-14 DIAGNOSIS — J019 Acute sinusitis, unspecified: Secondary | ICD-10-CM

## 2024-06-14 MED ORDER — TRIAMCINOLONE ACETONIDE 40 MG/ML IJ SUSP
40.0000 mg | Freq: Once | INTRAMUSCULAR | Status: AC
  Administered 2024-06-14: 40 mg via INTRAMUSCULAR

## 2024-06-14 MED ORDER — AMOXICILLIN-POT CLAVULANATE 875-125 MG PO TABS
1.0000 | ORAL_TABLET | Freq: Two times a day (BID) | ORAL | 0 refills | Status: DC
  Filled 2024-06-14: qty 14, 7d supply, fill #0

## 2024-06-14 NOTE — ED Provider Notes (Signed)
 " [name]    CSN: [id number] Arrival date & time: 06/14/24  0846      History   Chief Complaint Chief Complaint  Patient presents with   Nasal Congestion    Entered by patient    HPI Joy Horton is a 64 y.o. female.   Patient is a 64 year old female presents today with worsening sinus congestion.  Symptoms x 8 days. Nasal congestion followed by runny nose, cough. Pressure around right eye and right side of face. Unaware if fever. Patient has used dayquil, nyquil, mucinex.      Past Medical History:  Diagnosis Date   GERD (gastroesophageal reflux disease)    infrequent   hypothyroid    Puncture wound of buttock 08/14/2020   Tick fever 12/23/2015    Patient Active Problem List   Diagnosis Date Noted   Hyperlipidemia, mixed 07/14/2023   Great toe pain 07/13/2023   Exposure to COVID-19 virus 01/10/2023   Fatigue 03/21/2020   Anxiety 03/06/2017   Allergic rhinitis 06/22/2015   Vitamin D  deficiency 06/22/2015   Encounter for preventive health examination 06/22/2015   Routine general medical examination at a health care facility 05/04/2014   Cervical spondylosis without myelopathy 02/12/2013   Dyspareunia, female 04/27/2012   Lichen sclerosus et atrophicus 04/27/2012   Screening for breast cancer 04/01/2011   Screening for cervical cancer 04/01/2011   GERD (gastroesophageal reflux disease)    Hypothyroidism 03/31/2011    Past Surgical History:  Procedure Laterality Date   AUGMENTATION MAMMAPLASTY Bilateral 1995   METATARSAL OSTEOTOMY WITH BUNIONECTOMY Right 10/2022    OB History   No obstetric history on file.      Home Medications    Prior to Admission medications  Medication Sig Start Date End Date Taking? Authorizing Provider  amoxicillin -clavulanate (AUGMENTIN ) 875-125 MG tablet Take 1 tablet by mouth every 12 (twelve) hours. 06/14/24  Yes Shailen Thielen A, FNP  estradiol  (ESTRACE ) 0.1 MG/GM vaginal cream Place 1 Applicatorful vaginally  at bedtime. FOR 14 DAYS, then twice weekly thereafter 01/10/23   Marylynn Verneita CROME, MD  Multiple Vitamin (MULTIVITAMIN) tablet Take 1 tablet by mouth daily.    [provider]  SYNTHROID  88 MCG tablet TAKE 1 TABLET DAILY 06/05/24   Marylynn Verneita CROME, MD    Family History Family History  Problem Relation Age of Onset   Diabetes Mother    Arthritis Mother    Breast cancer Neg Hx     Social History Social History[1]   Allergies   Patient has no known allergies.   Review of Systems Review of Systems See HPI  Physical Exam Triage Vital Signs ED Triage Vitals  Encounter Vitals Group     BP 06/14/24 0903 124/84     Girls Systolic BP Percentile --      Girls Diastolic BP Percentile --      Boys Systolic BP Percentile --      Boys Diastolic BP Percentile --      Pulse Rate 06/14/24 0903 86     Resp 06/14/24 0903 20     Temp 06/14/24 0903 98.4 F (36.9 C)     Temp Source 06/14/24 0903 Oral     SpO2 06/14/24 0903 96 %     Weight --      Height --      Head Circumference --      Peak Flow --      Pain Score 06/14/24 0905 3     Pain Loc --  Pain Education --      Exclude from Growth Chart --    No data found.  Updated Vital Signs BP 124/84 (BP Location: Right Arm)   Pulse 86   Temp 98.4 F (36.9 C) (Oral)   Resp 20   SpO2 96%   Visual Acuity Right Eye Distance:   Left Eye Distance:   Bilateral Distance:    Right Eye Near:   Left Eye Near:    Bilateral Near:     Physical Exam Constitutional:      General: She is not in acute distress.    Appearance: Normal appearance. She is not ill-appearing, toxic-appearing or diaphoretic.  HENT:     Head: Normocephalic and atraumatic.     Right Ear: Tympanic membrane and ear canal normal.     Left Ear: Tympanic membrane and ear canal normal.     Nose: Congestion present.     Mouth/Throat:     Pharynx: Oropharynx is clear.  Eyes:     Conjunctiva/sclera: Conjunctivae normal.  Cardiovascular:     Rate and  Rhythm: Normal rate and regular rhythm.     Pulses: Normal pulses.     Heart sounds: Normal heart sounds.  Pulmonary:     Effort: Pulmonary effort is normal.     Breath sounds: Normal breath sounds.  Skin:    General: Skin is warm and dry.  Neurological:     Mental Status: She is alert.  Psychiatric:        Mood and Affect: Mood normal.      UC Treatments / Results  Labs (all labs ordered are listed, but only abnormal results are displayed) Labs Reviewed - No data to display  EKG   Radiology No results found.  Procedures Procedures (including critical care time)  Medications Ordered in UC Medications  triamcinolone  acetonide (KENALOG -40) injection 40 mg (has no administration in time range)    Initial Impression / Assessment and Plan / UC Course  I have reviewed the triage vital signs and the nursing notes.  Pertinent labs & imaging results that were available during my care of the patient were reviewed by me and considered in my medical decision making (see chart for details).     Acute sinusitis- Treating for sinus infection.  Antibiotics as prescribed.  Steroid injection given here today.  He can continue over-the-counter medicines as needed.  Follow-up as needed Final Clinical Impressions(s) / UC Diagnoses   Final diagnoses:  Acute non-recurrent sinusitis, unspecified location     Discharge Instructions      Treating you for sinus infection.  Antibiotics as prescribed.  Steroid injection given here today.  He can continue over-the-counter medicines as needed.  Follow-up as needed     ED Prescriptions     Medication Sig Dispense Auth. Provider   amoxicillin -clavulanate (AUGMENTIN ) 875-125 MG tablet Take 1 tablet by mouth every 12 (twelve) hours. 14 tablet Adah Wilbert LABOR, FNP      PDMP not reviewed this encounter.     [1]  Social History Tobacco Use   Smoking status: Never   Smokeless tobacco: Never  Substance Use Topics   Alcohol use: Yes     Alcohol/week: 5.0 standard drinks of alcohol    Types: 5 drink(s) per week   Drug use: No     Adah Wilbert LABOR, FNP 06/14/24 9071  "

## 2024-06-14 NOTE — ED Triage Notes (Signed)
 Symptoms x 8 days. Nasal congestion followed by runny nose, cough. Pressure around right eye and right side of face. Unaware if fever. Patient has used dayquil, nyquil, mucinex.

## 2024-06-14 NOTE — Discharge Instructions (Addendum)
 Treating you for sinus infection.  Antibiotics as prescribed.  Steroid injection given here today.  He can continue over-the-counter medicines as needed.  Follow-up as needed

## 2024-06-15 ENCOUNTER — Ambulatory Visit: Admitting: Family Medicine

## 2024-07-03 ENCOUNTER — Telehealth: Payer: Self-pay

## 2024-07-03 ENCOUNTER — Other Ambulatory Visit

## 2024-07-03 DIAGNOSIS — R7989 Other specified abnormal findings of blood chemistry: Secondary | ICD-10-CM

## 2024-07-03 DIAGNOSIS — E7800 Pure hypercholesterolemia, unspecified: Secondary | ICD-10-CM

## 2024-07-03 DIAGNOSIS — E782 Mixed hyperlipidemia: Secondary | ICD-10-CM

## 2024-07-03 DIAGNOSIS — E039 Hypothyroidism, unspecified: Secondary | ICD-10-CM

## 2024-07-03 DIAGNOSIS — R7301 Impaired fasting glucose: Secondary | ICD-10-CM

## 2024-07-03 NOTE — Telephone Encounter (Signed)
Lab orders placed for lab appt tomorrow.

## 2024-07-04 ENCOUNTER — Other Ambulatory Visit (INDEPENDENT_AMBULATORY_CARE_PROVIDER_SITE_OTHER)

## 2024-07-04 DIAGNOSIS — E7800 Pure hypercholesterolemia, unspecified: Secondary | ICD-10-CM

## 2024-07-04 DIAGNOSIS — R7301 Impaired fasting glucose: Secondary | ICD-10-CM

## 2024-07-04 DIAGNOSIS — R7989 Other specified abnormal findings of blood chemistry: Secondary | ICD-10-CM | POA: Diagnosis not present

## 2024-07-04 DIAGNOSIS — E039 Hypothyroidism, unspecified: Secondary | ICD-10-CM | POA: Diagnosis not present

## 2024-07-04 DIAGNOSIS — E782 Mixed hyperlipidemia: Secondary | ICD-10-CM | POA: Diagnosis not present

## 2024-07-04 LAB — COMPREHENSIVE METABOLIC PANEL WITH GFR
ALT: 16 U/L (ref 3–35)
AST: 24 U/L (ref 5–37)
Albumin: 4.6 g/dL (ref 3.5–5.2)
Alkaline Phosphatase: 59 U/L (ref 39–117)
BUN: 20 mg/dL (ref 6–23)
CO2: 30 meq/L (ref 19–32)
Calcium: 9.7 mg/dL (ref 8.4–10.5)
Chloride: 102 meq/L (ref 96–112)
Creatinine, Ser: 0.64 mg/dL (ref 0.40–1.20)
GFR: 92.56 mL/min
Glucose, Bld: 100 mg/dL — ABNORMAL HIGH (ref 70–99)
Potassium: 4.3 meq/L (ref 3.5–5.1)
Sodium: 139 meq/L (ref 135–145)
Total Bilirubin: 0.6 mg/dL (ref 0.2–1.2)
Total Protein: 7.1 g/dL (ref 6.0–8.3)

## 2024-07-04 LAB — LIPID PANEL
Cholesterol: 261 mg/dL — ABNORMAL HIGH (ref 28–200)
HDL: 56.5 mg/dL
LDL Cholesterol: 183 mg/dL — ABNORMAL HIGH (ref 10–99)
NonHDL: 204.97
Total CHOL/HDL Ratio: 5
Triglycerides: 108 mg/dL (ref 10.0–149.0)
VLDL: 21.6 mg/dL (ref 0.0–40.0)

## 2024-07-04 LAB — CBC WITH DIFFERENTIAL/PLATELET
Basophils Absolute: 0.1 10*3/uL (ref 0.0–0.1)
Basophils Relative: 1.1 % (ref 0.0–3.0)
Eosinophils Absolute: 0.1 10*3/uL (ref 0.0–0.7)
Eosinophils Relative: 1.7 % (ref 0.0–5.0)
HCT: 36.5 % (ref 36.0–46.0)
Hemoglobin: 12.4 g/dL (ref 12.0–15.0)
Lymphocytes Relative: 27.8 % (ref 12.0–46.0)
Lymphs Abs: 1.7 10*3/uL (ref 0.7–4.0)
MCHC: 34 g/dL (ref 30.0–36.0)
MCV: 92.9 fl (ref 78.0–100.0)
Monocytes Absolute: 0.4 10*3/uL (ref 0.1–1.0)
Monocytes Relative: 7.2 % (ref 3.0–12.0)
Neutro Abs: 3.7 10*3/uL (ref 1.4–7.7)
Neutrophils Relative %: 62.2 % (ref 43.0–77.0)
Platelets: 381 10*3/uL (ref 150.0–400.0)
RBC: 3.93 Mil/uL (ref 3.87–5.11)
RDW: 13.2 % (ref 11.5–15.5)
WBC: 6 10*3/uL (ref 4.0–10.5)

## 2024-07-04 LAB — HEMOGLOBIN A1C: Hgb A1c MFr Bld: 5.9 % (ref 4.6–6.5)

## 2024-07-04 LAB — TSH: TSH: 10.88 u[IU]/mL — ABNORMAL HIGH (ref 0.35–5.50)

## 2024-07-04 LAB — LDL CHOLESTEROL, DIRECT: Direct LDL: 175 mg/dL

## 2024-07-05 ENCOUNTER — Ambulatory Visit: Payer: Self-pay | Admitting: Internal Medicine

## 2024-07-06 ENCOUNTER — Ambulatory Visit: Admitting: Internal Medicine

## 2024-07-06 VITALS — BP 118/78 | HR 86 | Temp 98.5°F | Ht 59.0 in | Wt 120.2 lb

## 2024-07-06 DIAGNOSIS — R0789 Other chest pain: Secondary | ICD-10-CM

## 2024-07-06 DIAGNOSIS — E039 Hypothyroidism, unspecified: Secondary | ICD-10-CM | POA: Diagnosis not present

## 2024-07-06 DIAGNOSIS — R5383 Other fatigue: Secondary | ICD-10-CM

## 2024-07-06 DIAGNOSIS — Z780 Asymptomatic menopausal state: Secondary | ICD-10-CM

## 2024-07-06 MED ORDER — LEVOTHYROXINE SODIUM 100 MCG PO TABS: 100.0000 ug | ORAL_TABLET | Freq: Every day | ORAL | 1 refills | Status: AC

## 2024-07-06 NOTE — Patient Instructions (Addendum)
 TAKE 2 THYROID   TABLETS ON SUNDAYS ONLY.   TOTAL CUMULATIVE DOSE WILL BE 704 MCG /WEEK  WE WILL REPEAT TSH AFTER 6 WEEKS OF THIS REGIMEN   100 MG TABLET OF LEVOTHYROXINE  WILL BE  ON FILE AT [facility]   PLEASE GO GET A CHEST X RAY AT [facility] OFF OF [address]

## 2024-07-06 NOTE — Assessment & Plan Note (Addendum)
 TSH IS  ELEVATED ON 88 MCG DOSE  WILL INCREASE TO 100 MCG WITH NEXT REFILL,  FOR NOW ADVISED TO INCREASE DOSE TO 2 TABLEST ON SUNDAYS:  ( 88 MCG  X 8  IS THE SAME AS 100 MCG X 7 )  Lab Results  Component Value Date   TSH 10.88 (H) 07/04/2024

## 2024-07-08 DIAGNOSIS — R0789 Other chest pain: Secondary | ICD-10-CM | POA: Insufficient documentation

## 2024-07-08 NOTE — Assessment & Plan Note (Addendum)
 Atypical. Hest x ray ordered.  I have ordered and reviewed a 12 lead EKG and find that there are no acute changes and patient is in sinus rhythm.

## 2024-07-08 NOTE — Assessment & Plan Note (Signed)
 Attributed to hypothyroidism

## 2024-07-10 ENCOUNTER — Ambulatory Visit
Admission: RE | Admit: 2024-07-10 | Discharge: 2024-07-10 | Disposition: A | Source: Ambulatory Visit | Attending: Internal Medicine | Admitting: Internal Medicine

## 2024-07-10 DIAGNOSIS — R0789 Other chest pain: Secondary | ICD-10-CM

## 2024-07-11 ENCOUNTER — Ambulatory Visit: Payer: Self-pay | Admitting: Internal Medicine

## 2024-07-23 ENCOUNTER — Other Ambulatory Visit

## 2024-08-07 ENCOUNTER — Other Ambulatory Visit
